# Patient Record
Sex: Female | Born: 1945 | Race: White | Hispanic: No | Marital: Married | Smoking: Former smoker
Health system: Southern US, Community
[De-identification: ages and names within clinical notes are randomized; demographics above are authoritative.]

## PROBLEM LIST (undated history)

## (undated) ENCOUNTER — Ambulatory Visit

## (undated) DIAGNOSIS — E7800 Pure hypercholesterolemia, unspecified: Secondary | ICD-10-CM

## (undated) DIAGNOSIS — I700 Atherosclerosis of aorta: Secondary | ICD-10-CM

## (undated) DIAGNOSIS — M25559 Pain in unspecified hip: Secondary | ICD-10-CM

## (undated) DIAGNOSIS — I499 Cardiac arrhythmia, unspecified: Secondary | ICD-10-CM

## (undated) DIAGNOSIS — M1990 Unspecified osteoarthritis, unspecified site: Secondary | ICD-10-CM

## (undated) HISTORY — DX: Paroxysmal atrial fibrillation: I48.0

## (undated) HISTORY — DX: Unspecified osteoarthritis, unspecified site: M19.90

## (undated) HISTORY — DX: Unspecified atrial fibrillation: I48.91

## (undated) HISTORY — DX: Essential (primary) hypertension: I10

## (undated) HISTORY — DX: Pain in unspecified hip: M25.559

## (undated) HISTORY — DX: Atherosclerosis of aorta: I70.0

## (undated) HISTORY — DX: Pure hypercholesterolemia, unspecified: E78.00

---

## 1980-04-26 HISTORY — PX: TUBAL LIGATION: SHX77

## 1997-09-27 ENCOUNTER — Ambulatory Visit (HOSPITAL_COMMUNITY): Admission: RE | Admit: 1997-09-27 | Discharge: 1997-09-27 | Payer: Self-pay | Admitting: Gastroenterology

## 2000-06-16 ENCOUNTER — Other Ambulatory Visit: Admission: RE | Admit: 2000-06-16 | Discharge: 2000-06-16 | Payer: Self-pay | Admitting: Gynecology

## 2001-07-31 ENCOUNTER — Other Ambulatory Visit: Admission: RE | Admit: 2001-07-31 | Discharge: 2001-07-31 | Payer: Self-pay | Admitting: Gynecology

## 2002-12-10 ENCOUNTER — Other Ambulatory Visit: Admission: RE | Admit: 2002-12-10 | Discharge: 2002-12-10 | Payer: Self-pay | Admitting: Gynecology

## 2003-12-12 ENCOUNTER — Other Ambulatory Visit: Admission: RE | Admit: 2003-12-12 | Discharge: 2003-12-12 | Payer: Self-pay | Admitting: Gynecology

## 2004-04-26 HISTORY — PX: RIGHT OOPHORECTOMY: SHX2359

## 2005-01-21 ENCOUNTER — Other Ambulatory Visit: Admission: RE | Admit: 2005-01-21 | Discharge: 2005-01-21 | Payer: Self-pay | Admitting: Gynecology

## 2005-02-09 ENCOUNTER — Encounter (INDEPENDENT_AMBULATORY_CARE_PROVIDER_SITE_OTHER): Payer: Self-pay | Admitting: Specialist

## 2005-02-09 ENCOUNTER — Encounter (INDEPENDENT_AMBULATORY_CARE_PROVIDER_SITE_OTHER): Payer: Self-pay | Admitting: *Deleted

## 2005-02-09 ENCOUNTER — Ambulatory Visit: Admission: RE | Admit: 2005-02-09 | Discharge: 2005-02-09 | Payer: Self-pay | Admitting: Obstetrics and Gynecology

## 2008-04-11 ENCOUNTER — Other Ambulatory Visit: Admission: RE | Admit: 2008-04-11 | Discharge: 2008-04-11 | Payer: Self-pay | Admitting: Family Medicine

## 2010-06-24 ENCOUNTER — Other Ambulatory Visit: Payer: Self-pay | Admitting: Family Medicine

## 2010-06-24 ENCOUNTER — Other Ambulatory Visit (HOSPITAL_COMMUNITY)
Admission: RE | Admit: 2010-06-24 | Discharge: 2010-06-24 | Disposition: A | Discharge status: Home / Self Care | Payer: Federal, State, Local not specified - PPO | Source: Ambulatory Visit | Attending: Family Medicine | Admitting: Family Medicine

## 2010-06-24 DIAGNOSIS — Z124 Encounter for screening for malignant neoplasm of cervix: Secondary | ICD-10-CM | POA: Insufficient documentation

## 2010-09-10 ENCOUNTER — Ambulatory Visit
Admission: RE | Admit: 2010-09-10 | Discharge: 2010-09-10 | Disposition: A | Discharge status: Home / Self Care | Payer: Federal, State, Local not specified - PPO | Source: Ambulatory Visit | Attending: Family Medicine | Admitting: Family Medicine

## 2010-09-10 ENCOUNTER — Other Ambulatory Visit: Payer: Self-pay | Admitting: Family Medicine

## 2010-09-10 DIAGNOSIS — M25551 Pain in right hip: Secondary | ICD-10-CM

## 2010-09-11 NOTE — Op Note (Signed)
NAMEPETRICE, Katrina Oliver NO.:  [id number]   MEDICAL RECORD NO.:  [id number]          PATIENT TYPE:  INP   LOCATION:  9399                          FACILITY:  [facility]   PHYSICIAN:  Malva Limes, M.D.    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   POSTOPERATIVE DIAGNOSIS:  Left solid ovarian tumor, probable fibroadenoma.   PROCEDURE:  1.  Diagnostic laparoscopy with left salpingo-oophorectomy.  2.  Peritoneal washings.   SURGEON:  Malva Limes, M.D.   ASSISTANT:  Luvenia Redden, M.D.   ANESTHESIA:  General endotracheal.   ANTIBIOTICS:  Ancef 1 gram.   DRAINS:  Red rubber catheter to bladder.   ESTIMATED BLOOD LOSS:  Minimal.   COMPLICATIONS:  None.   SPECIMENS:  Left ovary and fallopian tube sent to pathology along with  peritoneal washings.   DESCRIPTION OF PROCEDURE:  The patient was taken to the operating room where  she was placed in the dorsal supine position.  General anesthesia was  administered without complications.  She was then placed in the dorsal  lithotomy position.  She was prepped with Betadine and her bladder drained  with a red rubber catheter.  The patient then had a Hulka tenaculum applied  to the anterior cervical lip.  Her umbilicus was injected with 0.25%  Marcaine and a vertical skin was made.  The fascia was grasped with Kochers  and opened with Mayo scissors.  The parietoperitoneum was entered bluntly.  Sutures were placed laterally.  The Hasson cannula was placed into the  abdominal cavity and 3 liters of carbon dioxide insufflated.  The patient  was placed in Trendelenburg.  A 5 mm port was placed in the suprapubic and  left lower quadrant under direct visualization.  Once this was accomplished,  the abdominal and pelvic areas were inspected.  There was no  evidence of any  satellite tumors anywhere.  There were no growths on the outside surface of  the left ovary.  There were no pelvic adhesions.  At this point  approximately 30 mL of peritoneal fluid was removed from the cul-de-sac and  sent for evaluation.  Next, the left ovary was grasped and the  infundibulopelvic ligament cauterized and transected with the tripolar  instrument.  The ovarian ligament was then cauterized, transected with the  tripolar followed by the fallopian tube.  Once the left ovary and fallopian  tube were freed, they were placed in the peritoneal cavity.  The 5 mm port  in the suprapubic region was exchanged for a 10 mm port.  The Endobag was  placed into the peritoneal cavity and the ovary and fallopian tube placed  into the bag.  Following this, the bag was lifted up to the anterior  abdominal wall where the incision was extended and the ovary and bag removed  intact.  At this point the parietoperitoneum was closed using 2-0 Vicryl  suture in a running fashion and the fascia was  closed using 0 Vicryl suture  in a running fashion.  Subcuticular tissue was closed using interrupted 2-0  Vicryl suture.  The skin was closed using 4-0 Rapide in a subcuticular  fashion.  The 5 mm port in the left lower quadrant was closed using  Dermabond.  The pelvis was then checked and no evidence of bleeding was  found from any of the port sites or the operative sites.  The  pneumoperitoneum was released.  Instruments all removed and the fascia  closed with interrupted 0 Vicryl suture in the umbilicus and the skin with 4-  0 Vicryl suture in an interrupted fashion.  The patient tolerated the  procedure well.  She was taken to the recovery room stable.  Needle, sponge,  and instrument counts correct x1.  The patient will be discharged to home.  She will be sent home with Percocet to take p.r.n.  She will follow up in  the office in 2 weeks.            ______________________________  Malva Limes, M.D.     MA/MEDQ  D:  02/09/2005  T:  02/09/2005  Job:  [id number]

## 2011-11-30 ENCOUNTER — Other Ambulatory Visit: Payer: Self-pay | Admitting: Gastroenterology

## 2015-09-04 ENCOUNTER — Encounter (HOSPITAL_COMMUNITY): Payer: Self-pay | Admitting: Nurse Practitioner

## 2015-09-04 ENCOUNTER — Ambulatory Visit (HOSPITAL_COMMUNITY)
Admission: RE | Admit: 2015-09-04 | Discharge: 2015-09-04 | Disposition: A | Discharge status: Home / Self Care | Payer: Federal, State, Local not specified - PPO | Source: Ambulatory Visit | Attending: Nurse Practitioner | Admitting: Nurse Practitioner

## 2015-09-04 VITALS — BP 120/78 | HR 79 | Ht 62.75 in | Wt 131.2 lb

## 2015-09-04 DIAGNOSIS — Z79899 Other long term (current) drug therapy: Secondary | ICD-10-CM | POA: Insufficient documentation

## 2015-09-04 DIAGNOSIS — Z87891 Personal history of nicotine dependence: Secondary | ICD-10-CM | POA: Diagnosis not present

## 2015-09-04 DIAGNOSIS — I480 Paroxysmal atrial fibrillation: Secondary | ICD-10-CM

## 2015-09-04 MED ORDER — APIXABAN 5 MG PO TABS: 5.0000 mg | ORAL_TABLET | Freq: Two times a day (BID) | ORAL | Status: DC

## 2015-09-04 NOTE — Patient Instructions (Signed)
Your physician has recommended you make the following change in your medication:  1)Stop Aspirin 2)Start Eliquis 5mg twice a day  

## 2015-09-04 NOTE — Progress Notes (Signed)
Patient ID: Katrina Oliver Loh, female   DOB: [birth year], 70 y.o.   MRN: [id number]     Primary Care Physician: Dr. Shaune Pollackonna Gates Referring Physician: Same    Katrina Oliver Netter is a 70 y.o. female with a h/o PAF that she first noticed very brief episodes that lasted 1-2 hours. She presented to the PCP office in March with an epiosode of irregular heart beat but by the time she got there, EKG showed SR. She presented to her PCP office yesterday and was found to have afib with rvr at 138 bpm. She was started on metoprolol and asked to be seen here today.  Now in SR.She does have a CHA2DS2VASc of 2  and would by guidelines be a candidate for anticoagulant. No bleeding issues.  Will stop asa. No previous echo.  Lifestyle issues reviewed and pt  states she is active at the gym, normal weight, no snoring history, no tobacco, does drink two alcoholic drinks a night.  Today, she denies symptoms of palpitations, chest pain, shortness of breath, orthopnea, PND, lower extremity edema, dizziness, presyncope, syncope, or neurologic sequela. The patient is tolerating medications without difficulties and is otherwise without complaint today.   No past medical history on file. Past Surgical History  Procedure Laterality Date  . Right oophorectomy    . Tubal ligation      Current Outpatient Prescriptions  Medication Sig Dispense Refill  . calcium carbonate (OSCAL) 1500 (600 Ca) MG TABS tablet Take 600 mg of elemental calcium by mouth daily with breakfast.    . cetirizine (ZYRTEC) 10 MG tablet Take 10 mg by mouth daily.    . cholecalciferol (VITAMIN D) 1000 units tablet Take 1,000 Units by mouth daily.    . Estradiol (VAGIFEM) 10 MCG TABS vaginal tablet Place 1 tablet vaginally 2 (two) times a week.    . fluticasone (FLONASE) 50 MCG/ACT nasal spray Place 1 spray into both nostrils as needed for allergies or rhinitis.    . metoprolol tartrate (LOPRESSOR) 25 MG tablet Take 25 mg by mouth 2 (two) times  daily.    . Multiple Vitamin (MULTIVITAMIN) tablet Take 1 tablet by mouth daily.    Marland Kitchen. olopatadine (PATANOL) 0.1 % ophthalmic solution Place 1 drop into both eyes as needed for allergies.    Marland Kitchen. omega-3 acid ethyl esters (LOVAZA) 1 g capsule Take 1 g by mouth 2 (two) times daily.    . ranitidine (ZANTAC) 75 MG tablet Take 75 mg by mouth as needed for heartburn.    Marland Kitchen. apixaban (ELIQUIS) 5 MG TABS tablet Take 1 tablet (5 mg total) by mouth 2 (two) times daily. 60 tablet 1   No current facility-administered medications for this encounter.    Allergies  Allergen Reactions  . Other     alloway eye drops    Social History   Social History  . Marital Status: Single    Spouse Name: N/A  . Number of Children: N/A  . Years of Education: N/A   Occupational History  . Not on file.   Social History Main Topics  . Smoking status: Former Games developermoker  . Smokeless tobacco: Not on file  . Alcohol Use: Not on file  . Drug Use: Not on file  . Sexual Activity: Not on file   Other Topics Concern  . Not on file   Social History Narrative  . No narrative on file    No family history on file.  ROS- All systems are reviewed and negative except  as per the HPI above  Physical Exam: Filed Vitals:   09/04/15 1340  BP: 120/78  Pulse: 79  Height: 5' 2.75" (1.594 m)  Weight: 131 lb 3.2 oz (59.512 kg)    GEN- The patient is well appearing, alert and oriented x 3 today.   Head- normocephalic, atraumatic Eyes-  Sclera clear, conjunctiva pink Ears- hearing intact Oropharynx- clear Neck- supple, no JVP Lymph- no cervical lymphadenopathy Lungs- Clear to ausculation bilaterally, normal work of breathing Heart- Regular rate and rhythm, no murmurs, rubs or gallops, PMI not laterally displaced GI- soft, NT, ND, + BS Extremities- no clubbing, cyanosis, or edema MS- no significant deformity or atrophy Skin- no rash or lesion Psych- euthymic mood, full affect Neuro- strength and sensation are  intact  EKG- NSR at 79 bpm, pr int 128 ms, qrs int 64 ms, qtc 410 ms EKG from PCP's office reviewed that showed afib/flutter at 138 bpm Labs-5/10 PCP labs creatinine 0.77, bun 16, K+ 4.7  Assessment and Plan: 1. PAF Continue metoprolol 25 mg bid Can take an extra 1/2 tab of metoprolol for breakthrough episodes Echo    2. Chadsvasc score of 2  No bleeding history Start eliquis 5 mg bid, 30 day card given, watch for signs of bleeding Stop asa Try to avoid NSAIDS on blood thinner   3. Lifestyle factors that trigger afib Decrease alcohol to no more than 2 drinks a week Continue exercise routine, normal weight Denies snoring history    F/u two weeks in afib clinic  Alycen Mack C. Matthew Folks [facility] [address] [place], [place] [address] [phone]

## 2015-09-05 ENCOUNTER — Ambulatory Visit (HOSPITAL_COMMUNITY)
Admission: RE | Admit: 2015-09-05 | Discharge: 2015-09-05 | Disposition: A | Discharge status: Home / Self Care | Payer: Federal, State, Local not specified - PPO | Source: Ambulatory Visit | Attending: Nurse Practitioner | Admitting: Nurse Practitioner

## 2015-09-05 DIAGNOSIS — I480 Paroxysmal atrial fibrillation: Secondary | ICD-10-CM | POA: Insufficient documentation

## 2015-09-05 DIAGNOSIS — I4891 Unspecified atrial fibrillation: Secondary | ICD-10-CM | POA: Diagnosis present

## 2015-09-05 DIAGNOSIS — I340 Nonrheumatic mitral (valve) insufficiency: Secondary | ICD-10-CM | POA: Insufficient documentation

## 2015-09-05 NOTE — Progress Notes (Signed)
Echocardiogram 2D Echocardiogram has been performed.  Katrina Oliver, Katrina Oliver M 09/05/2015, 12:01 PM

## 2015-09-07 ENCOUNTER — Encounter (HOSPITAL_COMMUNITY): Payer: Self-pay | Admitting: Nurse Practitioner

## 2015-09-17 ENCOUNTER — Encounter (HOSPITAL_COMMUNITY): Payer: Self-pay | Admitting: Nurse Practitioner

## 2015-09-17 ENCOUNTER — Ambulatory Visit (HOSPITAL_COMMUNITY)
Admission: RE | Admit: 2015-09-17 | Discharge: 2015-09-17 | Disposition: A | Discharge status: Home / Self Care | Payer: Federal, State, Local not specified - PPO | Source: Ambulatory Visit | Attending: Nurse Practitioner | Admitting: Nurse Practitioner

## 2015-09-17 VITALS — BP 128/72 | HR 66 | Ht 62.75 in | Wt 131.0 lb

## 2015-09-17 DIAGNOSIS — Z87891 Personal history of nicotine dependence: Secondary | ICD-10-CM | POA: Insufficient documentation

## 2015-09-17 DIAGNOSIS — Z79899 Other long term (current) drug therapy: Secondary | ICD-10-CM | POA: Diagnosis not present

## 2015-09-17 DIAGNOSIS — I480 Paroxysmal atrial fibrillation: Secondary | ICD-10-CM | POA: Insufficient documentation

## 2015-09-17 DIAGNOSIS — Z7901 Long term (current) use of anticoagulants: Secondary | ICD-10-CM | POA: Diagnosis not present

## 2015-09-17 DIAGNOSIS — I4891 Unspecified atrial fibrillation: Secondary | ICD-10-CM | POA: Diagnosis present

## 2015-09-17 DIAGNOSIS — I4892 Unspecified atrial flutter: Secondary | ICD-10-CM | POA: Insufficient documentation

## 2015-09-17 MED ORDER — APIXABAN 5 MG PO TABS: 5.0000 mg | ORAL_TABLET | Freq: Two times a day (BID) | ORAL | Status: DC

## 2015-09-17 NOTE — Patient Instructions (Signed)
Your physician has recommended you make the following change in your medication:  1)Decrease metoprolol to 12.5mg twice a day    

## 2015-09-17 NOTE — Progress Notes (Signed)
Patient ID: Mart PiggsJacqueline T Eynon, female   DOB: [birth year], 70 y.o.   MRN: [id number]        Primary Care Physician: Dr. Shaune Pollackonna Gates Referring Physician: Same    Mart PiggsJacqueline T Mceuen is a 70 y.o. female with a h/o PAF that she first noticed very brief episodes that lasted 1-2 hours. She presented to the PCP office in March with an epiosode of irregular heart beat but by the time she got there, EKG showed SR. She presented to her PCP office yesterday and was found to have afib with rvr at 138 bpm. She was started on metoprolol and asked to be seen here today.  Now in SR.She does have a CHA2DS2VASc of 2  and would by guidelines be a candidate for anticoagulant. No bleeding issues.  Will stop asa. No previous echo.  Lifestyle issues reviewed and pt  states she is active at the gym, normal weight, no snoring history, no tobacco, does drink two alcoholic drinks a night.She was started on eliquis and echo was ordered.  She returns 5/24, no further afib, but is c/o fatigue probably from metoprolol. Echo showed normal structure heart. No bleeding issues with eliquis.  Today, she denies symptoms of palpitations, chest pain, shortness of breath, orthopnea, PND, lower extremity edema, dizziness, presyncope, syncope, or neurologic sequela. The patient is tolerating medications without difficulties and is otherwise without complaint today.   No past medical history on file. Past Surgical History  Procedure Laterality Date  . Right oophorectomy    . Tubal ligation      Current Outpatient Prescriptions  Medication Sig Dispense Refill  . apixaban (ELIQUIS) 5 MG TABS tablet Take 1 tablet (5 mg total) by mouth 2 (two) times daily. 180 tablet 3  . calcium carbonate (OSCAL) 1500 (600 Ca) MG TABS tablet Take 600 mg of elemental calcium by mouth daily with breakfast.    . cetirizine (ZYRTEC) 10 MG tablet Take 10 mg by mouth daily.    . cholecalciferol (VITAMIN D) 1000 units tablet Take 1,000 Units by mouth  daily.    . Estradiol (VAGIFEM) 10 MCG TABS vaginal tablet Place 1 tablet vaginally 2 (two) times a week.    . fluticasone (FLONASE) 50 MCG/ACT nasal spray Place 1 spray into both nostrils as needed for allergies or rhinitis.    . metoprolol tartrate (LOPRESSOR) 25 MG tablet Take 12.5 mg by mouth 2 (two) times daily.    . Multiple Vitamin (MULTIVITAMIN) tablet Take 1 tablet by mouth daily.    Marland Kitchen. olopatadine (PATANOL) 0.1 % ophthalmic solution Place 1 drop into both eyes as needed for allergies.    Marland Kitchen. omega-3 acid ethyl esters (LOVAZA) 1 g capsule Take 1 g by mouth 2 (two) times daily.    . ranitidine (ZANTAC) 75 MG tablet Take 75 mg by mouth as needed for heartburn.     No current facility-administered medications for this encounter.    Allergies  Allergen Reactions  . Other     alloway eye drops    Social History   Social History  . Marital Status: Single    Spouse Name: N/A  . Number of Children: N/A  . Years of Education: N/A   Occupational History  . Not on file.   Social History Main Topics  . Smoking status: Former Games developermoker  . Smokeless tobacco: Not on file  . Alcohol Use: Not on file  . Drug Use: Not on file  . Sexual Activity: Not on file   Other  Topics Concern  . Not on file   Social History Narrative    No family history on file.  ROS- All systems are reviewed and negative except as per the HPI above  Physical Exam: Filed Vitals:   09/17/15 1100  BP: 128/72  Pulse: 66  Height: 5' 2.75" (1.594 m)  Weight: 131 lb (59.421 kg)    GEN- The patient is well appearing, alert and oriented x 3 today.   Head- normocephalic, atraumatic Eyes-  Sclera clear, conjunctiva pink Ears- hearing intact Oropharynx- clear Neck- supple, no JVP Lymph- no cervical lymphadenopathy Lungs- Clear to ausculation bilaterally, normal work of breathing Heart- Regular rate and rhythm, no murmurs, rubs or gallops, PMI not laterally displaced GI- soft, NT, ND, + BS Extremities- no  clubbing, cyanosis, or edema MS- no significant deformity or atrophy Skin- no rash or lesion Psych- euthymic mood, full affect Neuro- strength and sensation are intact  EKG- NSR at 66 bpm, pr int 124 ms, qrs int 74 ms, qtc 419 ms EKG from PCP's office reviewed that showed afib/flutter at 138 bpm Labs-5/10 PCP labs creatinine 0.77, bun 16, K+ 4.7  Assessment and Plan: 1. PAF Decrease metoprolol 25 mg 1/2 bid for c/o fatigue Can take an extra 1/2 tab of metoprolol for breakthrough episodes If that does not resolve fatigue , can stop metoprolol and try cardizem cd 120 mg a day Pt will let office know of results decreasing BB   2. Chadsvasc score of 2  No bleeding history Start eliquis 5 mg bid, 30 day card given, watch for signs of bleeding Stop asa Try to avoid NSAIDS on blood thinner   3. Lifestyle factors that trigger afib Decrease alcohol to no more than 2 drinks a week Continue exercise routine, normal weight Denies snoring history    F/u 3 months in afib clinic  Keinan Brouillet C. Matthew Folks [facility] [address] [place], [place] [address] [phone]

## 2015-10-06 ENCOUNTER — Encounter (HOSPITAL_COMMUNITY): Payer: Self-pay | Admitting: Nurse Practitioner

## 2015-10-06 ENCOUNTER — Other Ambulatory Visit (HOSPITAL_COMMUNITY): Payer: Self-pay | Admitting: *Deleted

## 2015-10-06 MED ORDER — METOPROLOL TARTRATE 25 MG PO TABS: 12.5000 mg | ORAL_TABLET | Freq: Two times a day (BID) | ORAL | Status: DC

## 2015-12-24 ENCOUNTER — Ambulatory Visit (HOSPITAL_COMMUNITY)
Admission: RE | Admit: 2015-12-24 | Discharge: 2015-12-24 | Disposition: A | Discharge status: Home / Self Care | Payer: Federal, State, Local not specified - PPO | Source: Ambulatory Visit | Attending: Nurse Practitioner | Admitting: Nurse Practitioner

## 2015-12-24 ENCOUNTER — Encounter (HOSPITAL_COMMUNITY): Payer: Self-pay | Admitting: Nurse Practitioner

## 2015-12-24 VITALS — BP 140/84 | HR 74 | Ht 62.75 in | Wt 131.6 lb

## 2015-12-24 DIAGNOSIS — I480 Paroxysmal atrial fibrillation: Secondary | ICD-10-CM | POA: Diagnosis present

## 2015-12-24 DIAGNOSIS — Z79899 Other long term (current) drug therapy: Secondary | ICD-10-CM | POA: Diagnosis not present

## 2015-12-24 DIAGNOSIS — Z87891 Personal history of nicotine dependence: Secondary | ICD-10-CM | POA: Diagnosis not present

## 2015-12-24 NOTE — Progress Notes (Signed)
Patient ID: Katrina Oliver, female   DOB: 1946/02/22, 70 y.o.   MRN: [id number]        Primary Care Physician: Dr. Shaune Pollack Referring Physician: Same    Katrina Oliver is a 70 y.o. female with a h/o PAF that she first noticed very brief episodes that lasted 1-2 hours. She presented to the PCP office in March with an epiosode of irregular heart beat but by the time she got there, EKG showed SR. She presented to her PCP office yesterday and was found to have afib with rvr at 138 bpm. She was started on metoprolol and asked to be seen here today.  Now in SR.She does have a CHA2DS2VASc of 2  and would by guidelines be a candidate for anticoagulant. No bleeding issues.  Will stop asa. No previous echo.  Lifestyle issues reviewed and pt  states she is active at the gym, normal weight, no snoring history, no tobacco, does drink two alcoholic drinks a night.She was started on eliquis and echo was ordered.  She returns 5/24, no further afib, but is c/o fatigue probably from metoprolol. Echo showed normal structure heart. No bleeding issues with eliquis.  F/u 8/30- She reports that she is doing well. Only had one episode of irregular heart beat that resolved within one hour with an extra metoprolol in the last three months. Cutting dose of metoprolol helped with the feeling of fatigue. No issues with  DOAC.   Today, she denies symptoms of palpitations, chest pain, shortness of breath, orthopnea, PND, lower extremity edema, dizziness, presyncope, syncope, or neurologic sequela. The patient is tolerating medications without difficulties and is otherwise without complaint today.   No past medical history on file. Past Surgical History:  Procedure Laterality Date  . RIGHT OOPHORECTOMY    . TUBAL LIGATION      Current Outpatient Prescriptions  Medication Sig Dispense Refill  . apixaban (ELIQUIS) 5 MG TABS tablet Take 1 tablet (5 mg total) by mouth 2 (two) times daily. 180 tablet 3  .  calcium carbonate (OSCAL) 1500 (600 Ca) MG TABS tablet Take 600 mg of elemental calcium by mouth daily with breakfast.    . cetirizine (ZYRTEC) 10 MG tablet Take 10 mg by mouth daily.    . cholecalciferol (VITAMIN D) 1000 units tablet Take 1,000 Units by mouth daily.    . Estradiol (VAGIFEM) 10 MCG TABS vaginal tablet Place 1 tablet vaginally 2 (two) times a week.    . fluticasone (FLONASE) 50 MCG/ACT nasal spray Place 1 spray into both nostrils as needed for allergies or rhinitis.    . metoprolol tartrate (LOPRESSOR) 25 MG tablet Take 0.5 tablets (12.5 mg total) by mouth 2 (two) times daily. 90 tablet 3  . Multiple Vitamin (MULTIVITAMIN) tablet Take 1 tablet by mouth daily.    Marland Kitchen olopatadine (PATANOL) 0.1 % ophthalmic solution Place 1 drop into both eyes as needed for allergies.    Marland Kitchen omega-3 acid ethyl esters (LOVAZA) 1 g capsule Take 1 g by mouth daily.     . ranitidine (ZANTAC) 75 MG tablet Take 75 mg by mouth as needed for heartburn.     No current facility-administered medications for this encounter.     Allergies  Allergen Reactions  . Other     alloway eye drops    Social History   Social History  . Marital status: Single    Spouse name: N/A  . Number of children: N/A  . Years of education: N/A  Occupational History  . Not on file.   Social History Main Topics  . Smoking status: Former Games developermoker  . Smokeless tobacco: Not on file  . Alcohol use Not on file  . Drug use: Unknown  . Sexual activity: Not on file   Other Topics Concern  . Not on file   Social History Narrative  . No narrative on file    No family history on file.  ROS- All systems are reviewed and negative except as per the HPI above  Physical Exam: Vitals:   12/24/15 1055  BP: 140/84  Pulse: 74  Weight: 131 lb 9.6 oz (59.7 kg)  Height: 5' 2.75" (1.594 m)    GEN- The patient is well appearing, alert and oriented x 3 today.   Head- normocephalic, atraumatic Eyes-  Sclera clear, conjunctiva  pink Ears- hearing intact Oropharynx- clear Neck- supple, no JVP Lymph- no cervical lymphadenopathy Lungs- Clear to ausculation bilaterally, normal work of breathing Heart- Regular rate and rhythm, no murmurs, rubs or gallops, PMI not laterally displaced GI- soft, NT, ND, + BS Extremities- no clubbing, cyanosis, or edema MS- no significant deformity or atrophy Skin- no rash or lesion Psych- euthymic mood, full affect Neuro- strength and sensation are intact  EKG- NSR at 77 bpm, with pr int 126 ms, qrs int 74 ms, qtc 435 ms Labs-5/10 PCP labs creatinine 0.77, bun 16, K+ 4.7 Echo-Study Conclusions  - Left ventricle: The cavity size was normal. Systolic function was   normal. The estimated ejection fraction was in the range of 55%   to 60%. Wall motion was normal; there were no regional wall   motion abnormalities. Left ventricular diastolic function   parameters were normal. - Mitral valve: There was mild regurgitation.  Assessment and Plan: 1. PAF with low af burden Continue metoprolol 25 mg 1/2 bid  Can take an extra 1/2 tab of metoprolol for breakthrough episodes   2. Chadsvasc score of 2  No bleeding history Start eliquis 5 mg bid, watch for signs of bleeding Off asa Try to avoid NSAIDS on blood thinner   3. Lifestyle factors that trigger afib Decrease alcohol to no more than 2 drinks a week Continue exercise routine, normal weight Denies snoring history    F/u 6 months in afib clinic  Chee Dimon C. Matthew Folksarroll, ANP-C [facility] [address] [place], [place] [address] [phone]

## 2016-01-06 ENCOUNTER — Encounter (HOSPITAL_COMMUNITY): Payer: Self-pay | Admitting: Nurse Practitioner

## 2016-06-30 ENCOUNTER — Ambulatory Visit (HOSPITAL_COMMUNITY)
Admission: RE | Admit: 2016-06-30 | Discharge: 2016-06-30 | Disposition: A | Discharge status: Home / Self Care | Payer: Federal, State, Local not specified - PPO | Source: Ambulatory Visit | Attending: Nurse Practitioner | Admitting: Nurse Practitioner

## 2016-06-30 ENCOUNTER — Encounter (HOSPITAL_COMMUNITY): Payer: Self-pay | Admitting: Nurse Practitioner

## 2016-06-30 VITALS — BP 134/78 | HR 76 | Ht 62.75 in | Wt 130.0 lb

## 2016-06-30 DIAGNOSIS — Z888 Allergy status to other drugs, medicaments and biological substances status: Secondary | ICD-10-CM | POA: Diagnosis not present

## 2016-06-30 DIAGNOSIS — Z87891 Personal history of nicotine dependence: Secondary | ICD-10-CM | POA: Insufficient documentation

## 2016-06-30 DIAGNOSIS — I480 Paroxysmal atrial fibrillation: Secondary | ICD-10-CM | POA: Diagnosis not present

## 2016-06-30 DIAGNOSIS — Z90721 Acquired absence of ovaries, unilateral: Secondary | ICD-10-CM | POA: Diagnosis not present

## 2016-06-30 DIAGNOSIS — Z9851 Tubal ligation status: Secondary | ICD-10-CM | POA: Insufficient documentation

## 2016-06-30 DIAGNOSIS — Z7902 Long term (current) use of antithrombotics/antiplatelets: Secondary | ICD-10-CM | POA: Diagnosis not present

## 2016-06-30 NOTE — Progress Notes (Addendum)
Patient ID: ADELL KOVAL, female   DOB: Aug 24, 1945, 71 y.o.   MRN: [id number]        Primary Care Physician: Dr. Shaune Pollack Referring Physician: Same    Katrina Oliver is a 71 y.o. female with a h/o PAF that she first noticed very brief episodes that lasted 1-2 hours. She presented to the PCP office in March with an epiosode of irregular heart beat but by the time she got there, EKG showed SR. She presented to her PCP office yesterday and was found to have afib with rvr at 138 bpm. She was started on metoprolol and asked to be seen here today.  Now in SR.She does have a CHA2DS2VASc of 2  and would by guidelines be a candidate for anticoagulant. No bleeding issues.  Will stop asa. No previous echo.  Lifestyle issues reviewed and pt  states she is active at the gym, normal weight, no snoring history, no tobacco, does drink two alcoholic drinks a night.She was started on eliquis and echo was ordered.  She returns 5/24, no further afib, but is c/o fatigue probably from metoprolol. Echo showed normal structure heart. No bleeding issues with eliquis.  F/u 8/30- She reports that she is doing well. Only had one episode of irregular heart beat that resolved within one hour with an extra metoprolol in the last three months. Cutting dose of metoprolol helped with the feeling of fatigue. No issues with  DOAC.   F/u 06/30/16. She reports that she has done well with 2 breakthrough afib episodes in 6 months. One lasted one hour, second episode lasted 2 hours. She takes an extra metoprolol with afib. No issues with blood thinner. Has physical in May with labs.  Today, she denies symptoms of palpitations, chest pain, shortness of breath, orthopnea, PND, lower extremity edema, dizziness, presyncope, syncope, or neurologic sequela. The patient is tolerating medications without difficulties and is otherwise without complaint today.   No past medical history on file. Past Surgical History:  Procedure  Laterality Date  . RIGHT OOPHORECTOMY    . TUBAL LIGATION      Current Outpatient Prescriptions  Medication Sig Dispense Refill  . apixaban (ELIQUIS) 5 MG TABS tablet Take 1 tablet (5 mg total) by mouth 2 (two) times daily. 180 tablet 3  . calcium carbonate (OSCAL) 1500 (600 Ca) MG TABS tablet Take 600 mg of elemental calcium by mouth daily with breakfast.    . cetirizine (ZYRTEC) 10 MG tablet Take 10 mg by mouth daily.    . cholecalciferol (VITAMIN D) 1000 units tablet Take 1,000 Units by mouth daily.    . Estradiol (VAGIFEM) 10 MCG TABS vaginal tablet Place 1 tablet vaginally 2 (two) times a week.    . fluticasone (FLONASE) 50 MCG/ACT nasal spray Place 1 spray into both nostrils as needed for allergies or rhinitis.    . metoprolol tartrate (LOPRESSOR) 25 MG tablet Take 0.5 tablets (12.5 mg total) by mouth 2 (two) times daily. 90 tablet 3  . Multiple Vitamin (MULTIVITAMIN) tablet Take 1 tablet by mouth daily.    Marland Kitchen olopatadine (PATANOL) 0.1 % ophthalmic solution Place 1 drop into both eyes as needed for allergies.    Marland Kitchen omega-3 acid ethyl esters (LOVAZA) 1 g capsule Take 1 g by mouth daily.     . ranitidine (ZANTAC) 75 MG tablet Take 75 mg by mouth as needed for heartburn.     No current facility-administered medications for this encounter.     Allergies  Allergen Reactions  . Other     alloway eye drops    Social History   Social History  . Marital status: Single    Spouse name: N/A  . Number of children: N/A  . Years of education: N/A   Occupational History  . Not on file.   Social History Main Topics  . Smoking status: Former Games developermoker  . Smokeless tobacco: Never Used  . Alcohol use Not on file  . Drug use: Unknown  . Sexual activity: Not on file   Other Topics Concern  . Not on file   Social History Narrative  . No narrative on file    No family history on file.  ROS- All systems are reviewed and negative except as per the HPI above  Physical Exam: Vitals:    06/30/16 1059  BP: 134/78  Pulse: 76  Weight: 130 lb (59 kg)  Height: 5' 2.75" (1.594 m)    GEN- The patient is well appearing, alert and oriented x 3 today.   Head- normocephalic, atraumatic Eyes-  Sclera clear, conjunctiva pink Ears- hearing intact Oropharynx- clear Neck- supple, no JVP Lymph- no cervical lymphadenopathy Lungs- Clear to ausculation bilaterally, normal work of breathing Heart- Regular rate and rhythm, no murmurs, rubs or gallops, PMI not laterally displaced GI- soft, NT, ND, + BS Extremities- no clubbing, cyanosis, or edema MS- no significant deformity or atrophy Skin- no rash or lesion Psych- euthymic mood, full affect Neuro- strength and sensation are intact  EKG- NSR at 76 bpm, with pr int 124 ms, qrs int 64 ms, qtc 450 ms Labs-5/10 PCP labs creatinine 0.77, bun 16, K+ 4.7 Echo-Study Conclusions  - Left ventricle: The cavity size was normal. Systolic function was   normal. The estimated ejection fraction was in the range of 55%   to 60%. Wall motion was normal; there were no regional wall   motion abnormalities. Left ventricular diastolic function   parameters were normal. - Mitral valve: There was mild regurgitation.  Assessment and Plan: 1. PAF with low af burden Continue metoprolol 25 mg 1/2 bid  Can take an extra 1/2 tab of metoprolol for breakthrough episodes  2. Chadsvasc score of 2  No bleeding history Start eliquis 5 mg bid, watch for signs of bleeding Is having labs drawn in May for physical and will have labs faxed here for review Try to avoid NSAIDS on blood thinner  3. Lifestyle factors that trigger afib Alcohol to no more than 2 drinks a week Continue exercise routine, normal weight Denies snoring history   F/u 6 months in afib clinic  Katrina Oliver, ANP-C Afib Clinic [facility] [address] [place], [place] [address] [phone]

## 2016-06-30 NOTE — Addendum Note (Signed)
Encounter addended by: Newman Niponna C Whitnie Deleon, NP on: 06/30/2016  1:54 PM<BR>    Actions taken: Sign clinical note

## 2016-09-05 ENCOUNTER — Other Ambulatory Visit (HOSPITAL_COMMUNITY): Payer: Self-pay | Admitting: Nurse Practitioner

## 2016-12-06 ENCOUNTER — Telehealth (HOSPITAL_COMMUNITY): Payer: Self-pay | Admitting: *Deleted

## 2016-12-06 NOTE — Telephone Encounter (Signed)
Pt cld to check with Lupita LeashDonna regarding starting Fosamax due to the package insert stating an increased risk of a-fib.  Per Rudi Cocoonna Carroll, NP who consulted with pharmacy there has been no noticed correlation between fosamax and increased afib. Pt should still check with dispensing pharmacist as well.  Pt was advised and understood.

## 2017-04-06 ENCOUNTER — Ambulatory Visit (HOSPITAL_COMMUNITY)
Admission: RE | Admit: 2017-04-06 | Discharge: 2017-04-06 | Disposition: A | Discharge status: Home / Self Care | Payer: Federal, State, Local not specified - PPO | Source: Ambulatory Visit | Attending: Nurse Practitioner | Admitting: Nurse Practitioner

## 2017-04-06 ENCOUNTER — Encounter (HOSPITAL_COMMUNITY): Payer: Self-pay | Admitting: Nurse Practitioner

## 2017-04-06 VITALS — BP 136/76 | HR 71 | Ht 62.75 in | Wt 126.0 lb

## 2017-04-06 DIAGNOSIS — I480 Paroxysmal atrial fibrillation: Secondary | ICD-10-CM | POA: Insufficient documentation

## 2017-04-06 DIAGNOSIS — Z79899 Other long term (current) drug therapy: Secondary | ICD-10-CM | POA: Diagnosis not present

## 2017-04-06 DIAGNOSIS — Z7901 Long term (current) use of anticoagulants: Secondary | ICD-10-CM | POA: Diagnosis not present

## 2017-04-06 DIAGNOSIS — Z87891 Personal history of nicotine dependence: Secondary | ICD-10-CM | POA: Insufficient documentation

## 2017-04-06 DIAGNOSIS — Z888 Allergy status to other drugs, medicaments and biological substances status: Secondary | ICD-10-CM | POA: Insufficient documentation

## 2017-04-06 NOTE — Progress Notes (Signed)
Patient ID: Katrina Oliver, female   DOB: Feb 04, 1946, 71 y.o.   MRN: [id number]        Primary Care Physician: Dr. Shaune Pollackonna Gates Referring Physician: Same    Katrina Oliver is a 71 y.o. female with a h/o PAF that she first noticed very brief episodes that lasted 1-2 hours. She presented to the PCP office in March with an epiosode of irregular heart beat but by the time she got there, EKG showed SR. She presented to her PCP office yesterday and was found to have afib with rvr at 138 bpm. She was started on metoprolol and asked to be seen here today.  Now in SR.She does have a CHA2DS2VASc of 2  and would by guidelines be a candidate for anticoagulant. No bleeding issues.  Will stop asa. No previous echo.  Lifestyle issues reviewed and pt  states she is active at the gym, normal weight, no snoring history, no tobacco, does drink two alcoholic drinks a night.She was started on eliquis and echo was ordered.  She returns 09/17/15, no further afib, but is c/o fatigue probably from metoprolol. Echo showed normal structure heart. No bleeding issues with eliquis.  F/u 12/23/16- She reports that she is doing well. Only had one episode of irregular heart beat that resolved within one hour with an extra metoprolol in the last three months. Cutting dose of metoprolol helped with the feeling of fatigue. No issues with  DOAC.   F/u in afib clinic 12/12, she reports in 9 months, 2 episodes of afib both lasting less than 2 hours each, converted with extra dose of metoprolol.No issues with eliquis.  Today, she denies symptoms of palpitations, chest pain, shortness of breath, orthopnea, PND, lower extremity edema, dizziness, presyncope, syncope, or neurologic sequela. The patient is tolerating medications without difficulties and is otherwise without complaint today.   History reviewed. No pertinent past medical history.   Current Outpatient Medications  Medication Sig Dispense Refill  . calcium  carbonate (OSCAL) 1500 (600 Ca) MG TABS tablet Take 600 mg of elemental calcium by mouth daily with breakfast.    . cetirizine (ZYRTEC) 10 MG tablet Take 10 mg by mouth daily.    . cholecalciferol (VITAMIN D) 1000 units tablet Take 1,000 Units by mouth daily.    Marland Kitchen. ELIQUIS 5 MG TABS tablet TAKE 1 TABLET TWICE A DAY 180 tablet 3  . Estradiol (VAGIFEM) 10 MCG TABS vaginal tablet Place 1 tablet vaginally 2 (two) times a week.    . fluticasone (FLONASE) 50 MCG/ACT nasal spray Place 1 spray into both nostrils as needed for allergies or rhinitis.    . metoprolol tartrate (LOPRESSOR) 25 MG tablet TAKE 1/2 TABLET (12.5 MG   TOTAL) TWO TIMES A DAY 90 tablet 3  . Multiple Vitamin (MULTIVITAMIN) tablet Take 1 tablet by mouth daily.    Marland Kitchen. olopatadine (PATANOL) 0.1 % ophthalmic solution Place 1 drop into both eyes as needed for allergies.    Marland Kitchen. omega-3 acid ethyl esters (LOVAZA) 1 g capsule Take 1 g by mouth daily.     . ranitidine (ZANTAC) 75 MG tablet Take 75 mg by mouth as needed for heartburn.     No current facility-administered medications for this encounter.     Allergies  Allergen Reactions  . Other     alloway eye drops    Social History   Socioeconomic History  . Marital status: Single    Spouse name: Not on file  . Number of children: Not  on file  . Years of education: Not on file  . Highest education level: Not on file  Social Needs  . Financial resource strain: Not on file  . Food insecurity - worry: Not on file  . Food insecurity - inability: Not on file  . Transportation needs - medical: Not on file  . Transportation needs - non-medical: Not on file  Occupational History  . Not on file  Tobacco Use  . Smoking status: Former Games developermoker  . Smokeless tobacco: Never Used  Substance and Sexual Activity  . Alcohol use: Not on file  . Drug use: Not on file  . Sexual activity: Not on file  Other Topics Concern  . Not on file  Social History Narrative  . Not on file    No family  history on file.  ROS- All systems are reviewed and negative except as per the HPI above  Physical Exam: Vitals:   04/06/17 1127  BP: 136/76  Pulse: 71  Weight: 126 lb (57.2 kg)  Height: 5' 2.75" (1.594 m)    GEN- The patient is well appearing, alert and oriented x 3 today.   Head- normocephalic, atraumatic Eyes-  Sclera clear, conjunctiva pink Ears- hearing intact Oropharynx- clear Neck- supple, no JVP Lymph- no cervical lymphadenopathy Lungs- Clear to ausculation bilaterally, normal work of breathing Heart- Regular rate and rhythm, no murmurs, rubs or gallops, PMI not laterally displaced GI- soft, NT, ND, + BS Extremities- no clubbing, cyanosis, or edema MS- no significant deformity or atrophy Skin- no rash or lesion Psych- euthymic mood, full affect Neuro- strength and sensation are intact  EKG- NSR at 71 bpm, with pr int 120 ms, qrs int 70 ms, qtc 419 ms Labs-5/10 PCP labs creatinine 0.77, bun 16, K+ 4.7 Echo-Study Conclusions  - Left ventricle: The cavity size was normal. Systolic function was   normal. The estimated ejection fraction was in the range of 55%   to 60%. Wall motion was normal; there were no regional wall   motion abnormalities. Left ventricular diastolic function   parameters were normal. - Mitral valve: There was mild regurgitation.  Assessment and Plan: 1. PAF with low af burden Continue metoprolol 25 mg 1/2 bid  Can take an extra 1/2 tab of metoprolol for breakthrough episodes   2. Chadsvasc score of 2  No bleeding history Continue eliquis 5 mg bid, creatinine  0.8 in 08/2016 from PCP office, and with age under 6180 with weight at 57 kg, is on appropriate dose  No bleeding issues Try to avoid NSAIDS on blood thinner   3. Lifestyle factors that trigger afib Decrease alcohol to no more than 2 drinks a week Continue exercise routine, normal weight Denies snoring history    F/u one year in afib [id number]  Lupita LeashDonna C. Matthew Folksarroll, ANP-C Afib  Clinic [facility] [address] [place], [place] [address] [phone]

## 2017-04-15 NOTE — Addendum Note (Signed)
Encounter addended by: Newman Niparroll, Guneet Delpino C, NP on: 04/15/2017 2:53 PM  Actions taken: LOS modified

## 2017-08-04 ENCOUNTER — Other Ambulatory Visit (HOSPITAL_COMMUNITY): Payer: Self-pay | Admitting: Nurse Practitioner

## 2018-04-06 ENCOUNTER — Ambulatory Visit (HOSPITAL_COMMUNITY)
Admission: RE | Admit: 2018-04-06 | Discharge: 2018-04-06 | Disposition: A | Discharge status: Home / Self Care | Payer: Federal, State, Local not specified - PPO | Source: Ambulatory Visit | Attending: Nurse Practitioner | Admitting: Nurse Practitioner

## 2018-04-06 VITALS — BP 142/78 | HR 69 | Ht 62.75 in | Wt 125.6 lb

## 2018-04-06 DIAGNOSIS — I480 Paroxysmal atrial fibrillation: Secondary | ICD-10-CM

## 2018-04-06 DIAGNOSIS — Z87891 Personal history of nicotine dependence: Secondary | ICD-10-CM | POA: Diagnosis not present

## 2018-04-06 DIAGNOSIS — Z7901 Long term (current) use of anticoagulants: Secondary | ICD-10-CM | POA: Insufficient documentation

## 2018-04-06 DIAGNOSIS — Z79899 Other long term (current) drug therapy: Secondary | ICD-10-CM | POA: Insufficient documentation

## 2018-04-06 NOTE — Progress Notes (Addendum)
Patient ID: Katrina Oliver, female   DOB: Sep 05, 1945, 72 y.o.   MRN: [id number]        Primary Care Physician: Dr. Shaune Pollackonna Gates Referring Physician: Same    Katrina Oliver is a 72 y.o. female with a h/o PAF that she first noticed very brief episodes that lasted 1-2 hours. She presented to the PCP office in March with an epiosode of irregular heart beat but by the time she got there, EKG showed SR. She presented to her PCP office yesterday and was found to have afib with rvr at 138 bpm. She was started on metoprolol and asked to be seen here today.  Now in SR.She does have a CHA2DS2VASc of 2  and would by guidelines be a candidate for anticoagulant. No bleeding issues.  Will stop asa. No previous echo.  Lifestyle issues reviewed and pt  states she is active at the gym, normal weight, no snoring history, no tobacco, does drink two alcoholic drinks a night.She was started on eliquis and echo was ordered.  She returns 09/17/15, no further afib, but is c/o fatigue probably from metoprolol. Echo showed normal structure heart. No bleeding issues with eliquis.  F/u 12/23/16- She reports that she is doing well. Only had one episode of irregular heart beat that resolved within one hour with an extra metoprolol in the last three months. Cutting dose of metoprolol helped with the feeling of fatigue. No issues with  DOAC.   F/u in afib clinic 12/12, she reports in 9 months, 2 episodes of afib both lasting less than 2 hours each, converted with extra dose of metoprolol.No issues with eliquis.  F/u in afib clinic, 12/12.  She has done well over the last year. Has had 2 episodes of afib lasted less than 2 hours. Usually  Takes an extra metoprolol for this. Doing well on her DOAC, no signsof bleeding. WIll obtain labs(CBC/BMET) drawn with PCP this past June for review.  Today, she denies symptoms of palpitations, chest pain, shortness of breath, orthopnea, PND, lower extremity edema, dizziness,  presyncope, syncope, or neurologic sequela. The patient is tolerating medications without difficulties and is otherwise without complaint today.   No past medical history on file.   Current Outpatient Medications  Medication Sig Dispense Refill  . calcium carbonate (OSCAL) 1500 (600 Ca) MG TABS tablet Take 600 mg of elemental calcium by mouth daily with breakfast.    . calcium carbonate (TITRALAC) 420 MG CHEW chewable tablet Chew 420 mg by mouth.    . cholecalciferol (VITAMIN D) 1000 units tablet Take 1,000 Units by mouth daily.    Marland Kitchen. ELIQUIS 5 MG TABS tablet TAKE 1 TABLET TWICE A DAY 180 tablet 3  . Estradiol (VAGIFEM) 10 MCG TABS vaginal tablet Place 1 tablet vaginally 2 (two) times a week.    . fluticasone (FLONASE) 50 MCG/ACT nasal spray Place 1 spray into both nostrils as needed for allergies or rhinitis.    . metoprolol tartrate (LOPRESSOR) 25 MG tablet TAKE 1/2 TABLET (12.5 MG   TOTAL) TWO TIMES A DAY 90 tablet 3  . Multiple Vitamin (MULTIVITAMIN) tablet Take 1 tablet by mouth daily.    Marland Kitchen. olopatadine (PATANOL) 0.1 % ophthalmic solution Place 1 drop into both eyes as needed for allergies.    Marland Kitchen. omega-3 acid ethyl esters (LOVAZA) 1 g capsule Take 1 g by mouth daily.     . fexofenadine (ALLEGRA) 180 MG tablet Take 180 mg by mouth daily.     No current  facility-administered medications for this encounter.     Allergies  Allergen Reactions  . Other     alloway eye drops    Social History   Socioeconomic History  . Marital status: Single    Spouse name: Not on file  . Number of children: Not on file  . Years of education: Not on file  . Highest education level: Not on file  Occupational History  . Not on file  Social Needs  . Financial resource strain: Not on file  . Food insecurity:    Worry: Not on file    Inability: Not on file  . Transportation needs:    Medical: Not on file    Non-medical: Not on file  Tobacco Use  . Smoking status: Former Games developer  . Smokeless  tobacco: Never Used  Substance and Sexual Activity  . Alcohol use: Not on file  . Drug use: Not on file  . Sexual activity: Not on file  Lifestyle  . Physical activity:    Days per week: Not on file    Minutes per session: Not on file  . Stress: Not on file  Relationships  . Social connections:    Talks on phone: Not on file    Gets together: Not on file    Attends religious service: Not on file    Active member of club or organization: Not on file    Attends meetings of clubs or organizations: Not on file    Relationship status: Not on file  . Intimate partner violence:    Fear of current or ex partner: Not on file    Emotionally abused: Not on file    Physically abused: Not on file    Forced sexual activity: Not on file  Other Topics Concern  . Not on file  Social History Narrative  . Not on file    No family history on file.  ROS- All systems are reviewed and negative except as per the HPI above  Physical Exam: Vitals:   04/06/18 1049  BP: (!) 142/78  Pulse: 69  Weight: 57 kg  Height: 5' 2.75" (1.594 m)    GEN- The patient is well appearing, alert and oriented x 3 today.   Head- normocephalic, atraumatic Eyes-  Sclera clear, conjunctiva pink Ears- hearing intact Oropharynx- clear Neck- supple, no JVP Lymph- no cervical lymphadenopathy Lungs- Clear to ausculation bilaterally, normal work of breathing Heart- Regular rate and rhythm, no murmurs, rubs or gallops, PMI not laterally displaced GI- soft, NT, ND, + BS Extremities- no clubbing, cyanosis, or edema MS- no significant deformity or atrophy Skin- no rash or lesion Psych- euthymic mood, full affect Neuro- strength and sensation are intact  EKG- NSR at 69 bpm, with pr int 123 ms, qrs int 64 ms, qtc 409 ms Labs-from PCP this past June requested for reeview Echo-Study Conclusions  - Left ventricle: The cavity size was normal. Systolic function was   normal. The estimated ejection fraction was in the  range of 55%   to 60%. Wall motion was normal; there were no regional wall   motion abnormalities. Left ventricular diastolic function   parameters were normal. - Mitral valve: There was mild regurgitation.  Assessment and Plan: 1. PAF with very low AF burden Continue metoprolol 25 mg 1/2 bid  Can take an extra 1/2 tab of metoprolol for breakthrough episodes  2. Chadsvasc score of 2  No bleeding history Continue eliquis 5 mg bid  No bleeding issues Try to  avoid NSAIDS on blood thinner  3. Lifestyle factors that trigger afib Limit alcohol to no more than 2 drinks a week Continue exercise routine, normal weight Denies snoring history    F/u one year in afib clinic   Addendum: 04/12/18, labs received from PCP, dated 10/03/17 Creatinine at 0.74, bmet otherwise unremarkable,no dose adjustment is needed with eliquis  Lupita Leash C. Matthew Folks Afib Clinic [facility] [address] [place], [place] [phone]

## 2018-04-12 NOTE — Addendum Note (Signed)
Encounter addended by: Newman Niparroll, Aneri Slagel C, NP on: 04/12/2018 1:22 PM  Actions taken: Clinical Note Signed

## 2018-08-19 ENCOUNTER — Other Ambulatory Visit (HOSPITAL_COMMUNITY): Payer: Self-pay | Admitting: Nurse Practitioner

## 2018-09-11 ENCOUNTER — Other Ambulatory Visit (HOSPITAL_COMMUNITY): Payer: Self-pay | Admitting: Nurse Practitioner

## 2018-10-19 ENCOUNTER — Encounter: Payer: Self-pay | Admitting: Physical Therapy

## 2018-10-19 ENCOUNTER — Other Ambulatory Visit: Payer: Self-pay

## 2018-10-19 ENCOUNTER — Ambulatory Visit: Payer: Federal, State, Local not specified - PPO | Attending: Family Medicine | Admitting: Physical Therapy

## 2018-10-19 DIAGNOSIS — M6281 Muscle weakness (generalized): Secondary | ICD-10-CM | POA: Diagnosis present

## 2018-10-19 DIAGNOSIS — M25662 Stiffness of left knee, not elsewhere classified: Secondary | ICD-10-CM | POA: Insufficient documentation

## 2018-10-19 DIAGNOSIS — R2689 Other abnormalities of gait and mobility: Secondary | ICD-10-CM | POA: Diagnosis present

## 2018-10-19 DIAGNOSIS — M25562 Pain in left knee: Secondary | ICD-10-CM

## 2018-10-19 NOTE — Therapy (Signed)
[facility] [address], [address] [place], [place], [address] Phone: [phone]   Fax:  [phone]  Physical Therapy Evaluation  Patient Details  Name: Katrina PiggsJacqueline T Ligman MRN: [id number] Date of Birth: 10/03/1945 Referring Provider (PT): Dr. Shirlean Mylararol Webb   Encounter Date: 10/19/2018  PT End of Session - 10/19/18 1220    Visit Number  1    Date for PT Re-Evaluation  12/14/18    Authorization Type  BCBS federal    PT Start Time  1100    PT Stop Time  1135    PT Time Calculation (min)  35 min    Activity Tolerance  Patient tolerated treatment well;No increased pain    Behavior During Therapy  [facility] for tasks assessed/performed       History reviewed. No pertinent past medical history.  Past Surgical History:  Procedure Laterality Date  . RIGHT OOPHORECTOMY    . TUBAL LIGATION      There were no vitals filed for this visit.   Subjective Assessment - 10/19/18 1107    Subjective  Patient reports her left knee has been bothering her for the last 3 months.    Patient Stated Goals  reduce pain, keep doing exercise    Currently in Pain?  Yes    Pain Score  7    low 1/10   Pain Location  Knee    Pain Orientation  Left    Pain Descriptors / Indicators  Aching    Pain Type  Acute pain    Pain Onset  More than a month ago    Pain Frequency  Intermittent    Aggravating Factors   sit down after exercise    Pain Relieving Factors  heat         [facility] PT Assessment - 10/19/18 0001      Assessment   Medical Diagnosis  M76.52 Patellar tendonitis of left knee    Referring Provider (PT)  Dr. Shirlean Mylararol Webb    Onset Date/Surgical Date  07/19/18      Precautions   Precautions  None      Restrictions   Weight Bearing Restrictions  No      Balance Screen   Has the patient fallen in the past 6 months  No    Has the patient had a decrease in activity level because of a fear of falling?   No    Is the patient reluctant to  leave their home because of a fear of falling?   No      Home Public house managernvironment   Living Environment  Private residence      Prior Function   Level of Independence  Independent    Leisure  exercise daily      Cognition   Overall Cognitive Status  Within Functional Limits for tasks assessed      Posture/Postural Control   Posture/Postural Control  No significant limitations      ROM / Strength   AROM / PROM / Strength  AROM;PROM;Strength      PROM   Left Hip Flexion  110    Left Knee Extension  -5      Strength   Left Hip Flexion  4/5    Left Hip Extension  4/5    Left Hip External Rotation  4/5    Left Hip Internal Rotation  4/5    Left Hip ABduction  4/5    Left Knee Flexion  4/5  Left Knee Extension  4-/5      Ambulation/Gait   Stairs  Yes    Stairs Assistance  6: Modified independent (Device/Increase time)    Stair Management Technique  Step to pattern                Objective measurements completed on examination: See above findings.      [facility] Adult PT Treatment/Exercise - 10/19/18 0001      Lumbar Exercises: Stretches   Active Hamstring Stretch  Left;1 rep;30 seconds   with strap supine   Active Hamstring Stretch Limitations  pulled leg inward 30 sec then outward 30 seconds    Gastroc Stretch  Left;1 rep;30 seconds      Knee/Hip Exercises: Supine   Quad Sets  Strengthening;Left;1 set;10 reps   hold 5 sec            PT Education - 10/19/18 1123    Education Details  Access Code: [email or web address]    Person(s) Educated  Patient    Methods  Explanation;Demonstration;Handout    Comprehension  Verbalized understanding;Returned demonstration       PT Short Term Goals - 10/19/18 1231      PT SHORT TERM GOAL #1   Title  independent with initial HEP    Time  4    Period  Weeks    Status  New    Target Date  11/16/18      PT SHORT TERM GOAL #2   Title  pain in left knee after exercise decreased >/= 25% due to improve mobility and strength     Time  4    Period  Weeks    Status  New    Target Date  11/16/18        PT Long Term Goals - 10/19/18 1124      PT LONG TERM GOAL #1   Title  independent with HEP and understand how to progress herself    Time  8    Period  Weeks    Status  New    Target Date  12/14/18      PT LONG TERM GOAL #2   Title  go up and down stairs with step over step with minimal difficulty    Time  8    Period  Weeks    Status  New    Target Date  12/14/18      PT LONG TERM GOAL #3   Title  able to exercise at the gym with knee pain afterwards no more than 1-2/10    Time  8    Period  Weeks    Status  New    Target Date  12/14/18      PT LONG TERM GOAL #4   Title  left knee extension 0 degrees wo she is able to walk and exercise with minimal to no pain    Time  8    Period  Weeks    Status  New    Target Date  12/14/18      PT LONG TERM GOAL #5   Title  FOTO score </= 18%    Time  8    Period  Weeks    Status  New    Target Date  12/14/18             Plan - 10/19/18 1222    Clinical Impression Statement  Patient is a 73 year old female with acute left knee pain at levle  6/10 intermittently. Patient reports sudden onset of pain. Patient left knee extension is -5 degrees. Left knee and hip strength is 4/5. Patient has palpable tenderness along the lateral knee joint and patella tendon. Patient has tight hip flexors and gastroc. Patient has to go up and down steps with a step to step pattern. Patient ambulates with decreased left knee extension. Patient pain is worse with stairs, after exercise. Patient will benefit from skilled therapy to reduce left knee pain and increase strength to resume her activities.    Personal Factors and Comorbidities  Comorbidity 1;Fitness    Comorbidities  osteopenia    Examination-Activity Limitations  Locomotion Level;Stairs    Stability/Clinical Decision Making  Stable/Uncomplicated    Clinical Decision Making  Low    Rehab Potential  Excellent     PT Frequency  2x / week    PT Duration  8 weeks    PT Treatment/Interventions  Cryotherapy;Electrical Stimulation;Iontophoresis 4mg /ml Dexamethasone;Moist Heat;Ultrasound;Therapeutic exercise;Therapeutic activities;Gait training;Stair training;Neuromuscular re-education;Patient/family education;Passive range of motion;Dry needling;Manual techniques;Taping;Joint Manipulations    PT Next Visit Plan  work on left knee extension, hip strength for HEP, flexibility for hamstring, gastroc, hip flexor, ionto if MD signes note    PT Home Exercise Plan  Access Code: [facility]       Patient will benefit from skilled therapeutic intervention in order to improve the following deficits and impairments:  Abnormal gait, Decreased range of motion, Decreased endurance, Decreased activity tolerance, Pain, Impaired flexibility, Decreased strength  Visit Diagnosis: 1. Acute pain of left knee   2. Stiffness of left knee, not elsewhere classified   3. Muscle weakness (generalized)   4. Other abnormalities of gait and mobility        Problem List There are no active problems to display for this patient.   Earlie Counts, PT 10/19/18 12:35 PM   [facility] [address] [place], [place], [address] Phone: [phone]   Fax:  [phone]  Name: TEASHA MURRILLO MRN: [id number] Date of Birth: 29-Apr-1945

## 2018-10-19 NOTE — Patient Instructions (Signed)
Access Code: [email or web address]  URL: [email or web address]  Date: 10/19/2018  Prepared by: Earlie Counts   Exercises Hooklying Hamstring Stretch with Strap - 2 reps - 1 sets - 30 sec hold - 1x daily - 7x weekly Gastroc Stretch on Wall - 2 reps - 1 sets - 30 sec hold - 1x daily - 7x weekly Long Sitting Quad Set - 10 reps - 1 sets - 5 sec hold - 1x daily - 7x weekly Quadriceps Stretch with Chair - 2 reps - 1 sets - 30 sec hold - 1x daily - 7x weekly [facility] [address], [place], [place] [address] Phone # [phone] Fax [phone]

## 2018-10-25 ENCOUNTER — Ambulatory Visit: Payer: Federal, State, Local not specified - PPO | Attending: Family Medicine | Admitting: Physical Therapy

## 2018-10-25 ENCOUNTER — Other Ambulatory Visit: Payer: Self-pay

## 2018-10-25 DIAGNOSIS — M25662 Stiffness of left knee, not elsewhere classified: Secondary | ICD-10-CM

## 2018-10-25 DIAGNOSIS — M6281 Muscle weakness (generalized): Secondary | ICD-10-CM | POA: Diagnosis present

## 2018-10-25 DIAGNOSIS — R2689 Other abnormalities of gait and mobility: Secondary | ICD-10-CM

## 2018-10-25 DIAGNOSIS — M25562 Pain in left knee: Secondary | ICD-10-CM

## 2018-10-25 NOTE — Therapy (Signed)
[facility] [address], [address] [place], [place], [address] Phone: [phone]   Fax:  [phone]  Physical Therapy Treatment  Patient Details  Name: Katrina Oliver MRN: [id number] Date of Birth: 1945/08/24 Referring Provider (PT): Dr. Shirlean Mylararol Webb   Encounter Date: 10/25/2018  PT End of Session - 10/25/18 1536    Visit Number  2    Date for PT Re-Evaluation  12/14/18    Authorization Type  BCBS federal    PT Start Time  1532    PT Stop Time  1615    PT Time Calculation (min)  43 min    Activity Tolerance  Patient tolerated treatment well;No increased pain    Behavior During Therapy  [facility] assessed/[facility]       No past medical history on file.  Past Surgical History:  Procedure Laterality Date  . RIGHT OOPHORECTOMY    . TUBAL LIGATION      There were no vitals filed for this visit.  Subjective Assessment - 10/25/18 1534    Subjective  Pt states there is no pain currently.  State she is feeling better with the stretches so far.    Patient Stated Goals  reduce pain, keep doing exercise    Currently in Pain?  No/denies                       [facility]/Ex[facility] - 10/25/18 0001      Lumbar Exercises: Stretches   Active Hamstring Stretch  Left;1 rep;30 seconds   standing at steps   Hip Flexor Stretch  Left;1 rep;30 seconds   standing at steps   Quad Stretch  Left;3 reps;20 seconds   prone with strap     Lumbar Exercises: Aerobic   Nustep  L1x3 min; L3 x 3 min   PT present for status update     Lumbar Exercises: Machines for Strengthening   Leg Press  80lb bilat seat 6; single 30lb Lt LE 10 reps      Lumbar Exercises: Supine   Clam  20 reps    Bridge  20 reps   red band   Straight Leg Raise  20 reps   with hip ER     Knee/Hip Exercises: Sidelying   Hip ABduction  Strengthening;Left;20 reps      Manual Therapy   Manual Therapy  Soft tissue mobilization    Soft tissue mobilization  vastus lateralis and quad tendon               PT Short Term Goals - 10/25/18 1537      PT SHORT TERM GOAL #1   Title  independent with initial HEP    Status  Achieved      PT SHORT TERM GOAL #2   Title  pain in left knee after exercise decreased >/= 25% due to improve mobility and strength    Baseline  30% better    Status  Achieved        PT Long Term Goals - 10/19/18 1124      PT LONG TERM GOAL #1   Title  independent with HEP and understand how to progress herself    Time  8    Period  Weeks    Status  New    Target Date  12/14/18      PT LONG TERM GOAL #2   Title  go up and down stairs with step over  step with minimal difficulty    Time  8    Period  Weeks    Status  New    Target Date  12/14/18      PT LONG TERM GOAL #3   Title  able to exercise at the gym with knee pain afterwards no more than 1-2/10    Time  8    Period  Weeks    Status  New    Target Date  12/14/18      PT LONG TERM GOAL #4   Title  left knee extension 0 degrees wo she is able to walk and exercise with minimal to no pain    Time  8    Period  Weeks    Status  New    Target Date  12/14/18      PT LONG TERM GOAL #5   Title  FOTO score </= 18%    Time  8    Period  Weeks    Status  New    Target Date  12/14/18            Plan - 10/25/18 1555    Clinical Impression Statement  Pt has achieved both short term goals.  Pt did well with treatment today.  She had muscle knots in lateral quadrucep on the left LE.  Muscle released with STM.  She demonstrates decreased knee ext on left LE.  Pt will benefit from skilled PT to continue to progress according to POC    PT Treatment/Interventions  Cryotherapy;Electrical Stimulation;Iontophoresis 4mg /ml Dexamethasone;Moist Heat;Ultrasound;Therapeutic exercise;Therapeutic activities;Gait training;Stair training;Neuromuscular re-education;Patient/family education;Passive range of motion;Dry needling;Manual  techniques;Taping;Joint Manipulations    PT Next Visit Plan  work on left knee extension, hip strength for HEP, flexibility for hamstring, gastroc, hip flexor, ionto if needed    PT Home Exercise Plan  Access Code: [email or web address]    [email or web address] and Agree with Plan of Care  Patient       Patient will benefit from skilled therapeutic intervention in order to improve the following deficits and impairments:  Abnormal gait, Decreased range of motion, Decreased endurance, Decreased activity tolerance, Pain, Impaired flexibility, Decreased strength  Visit Diagnosis: 1. Acute pain of left knee   2. Stiffness of left knee, not elsewhere classified   3. Muscle weakness (generalized)   4. Other abnormalities of gait and mobility        Problem List There are no active problems to display for this patient.   Camillo Flaming Daylen Hack, PT 10/25/2018, 5:01 PM  [facility] [address] [place], [place], [address] Phone: [phone]   Fax:  [phone]  Name: Katrina Oliver MRN: [id number] Date of Birth: Feb 08, 1946

## 2018-10-31 ENCOUNTER — Other Ambulatory Visit: Payer: Self-pay

## 2018-10-31 ENCOUNTER — Ambulatory Visit: Payer: Federal, State, Local not specified - PPO | Admitting: Physical Therapy

## 2018-10-31 DIAGNOSIS — R2689 Other abnormalities of gait and mobility: Secondary | ICD-10-CM

## 2018-10-31 DIAGNOSIS — M25562 Pain in left knee: Secondary | ICD-10-CM

## 2018-10-31 DIAGNOSIS — M6281 Muscle weakness (generalized): Secondary | ICD-10-CM

## 2018-10-31 DIAGNOSIS — M25662 Stiffness of left knee, not elsewhere classified: Secondary | ICD-10-CM

## 2018-10-31 NOTE — Therapy (Signed)
[facility] [address] [place], [place], [address] Phone: [phone]   Fax:  [phone]  Physical Therapy Treatment  Patient Details  Name: Katrina Oliver MRN: [id number] Date of Birth: [birth year] Referring Provider (PT): Dr. Maurice Small   Encounter Date: 10/31/2018  PT End of Session - 10/31/18 1431    Visit Number  3    Date for PT Re-Evaluation  12/14/18    Authorization Type  BCBS federal    PT Start Time  4818    PT Stop Time  1515    PT Time Calculation (min)  44 min    Activity Tolerance  Patient tolerated treatment well;No increased pain    Behavior During Therapy  [facility] for tasks assessed/performed       No past medical history on file.  Past Surgical History:  Procedure Laterality Date  . RIGHT OOPHORECTOMY    . TUBAL LIGATION      There were no vitals filed for this visit.  Subjective Assessment - 10/31/18 1435    Subjective  Pt states she has no pain today.  States the only thing that hurts is the hamstring stretch with the strap.  My pain is only lasting 10-15 minutes, it was about 45 minutes previously.  I found out I have osteoporosis.    Pertinent History  osteoporosis    Patient Stated Goals  reduce pain, keep doing exercise    Currently in Pain?  No/denies                       [facility] Adult PT Treatment/Exercise - 10/31/18 0001      Lumbar Exercises: Stretches   Active Hamstring Stretch  Left;30 seconds;3 reps   standing at steps   [employer] seconds;2 reps   prone with strap   Gastroc Stretch  Left;1 rep;30 seconds      Lumbar Exercises: Aerobic   Nustep  L2x3 min; L3 x 3 min   PT present for status update     Lumbar Exercises: Standing   Other Standing Lumbar Exercises  monster walks fwd and side - red band - 30 ft each      Lumbar Exercises: Supine   Bridge  20 reps      Knee/Hip Exercises: Supine   Short Arc Quad Sets  Strengthening;Left    30 reps   Short Arc Quad Sets Limitations  2lb      Knee/Hip Exercises: Sidelying   Hip ABduction  Strengthening;Left;20 reps      Manual Therapy   Soft tissue mobilization  vastus lateralis and quad tendon Lt LE             PT Education - 10/31/18 1516    Education Details  Access Code: [email or web address]    Person(s) Educated  Patient    Methods  Explanation;Demonstration;Handout;Verbal cues    Comprehension  Verbalized understanding;Returned demonstration       PT Short Term Goals - 10/25/18 1537      PT SHORT TERM GOAL #1   Title  independent with initial HEP    Status  Achieved      PT SHORT TERM GOAL #2   Title  pain in left knee after exercise decreased >/= 25% due to improve mobility and strength    Baseline  30% better    Status  Achieved        PT Long Term Goals - 10/19/18 1124  PT LONG TERM GOAL #1   Title  independent with HEP and understand how to progress herself    Time  8    Period  Weeks    Status  New    Target Date  12/14/18      PT LONG TERM GOAL #2   Title  go up and down stairs with step over step with minimal difficulty    Time  8    Period  Weeks    Status  New    Target Date  12/14/18      PT LONG TERM GOAL #3   Title  able to exercise at the gym with knee pain afterwards no more than 1-2/10    Time  8    Period  Weeks    Status  New    Target Date  12/14/18      PT LONG TERM GOAL #4   Title  left knee extension 0 degrees wo she is able to walk and exercise with minimal to no pain    Time  8    Period  Weeks    Status  New    Target Date  12/14/18      PT LONG TERM GOAL #5   Title  FOTO score </= 18%    Time  8    Period  Weeks    Status  New    Target Date  12/14/18            Plan - 10/31/18 1516    Clinical Impression Statement  Pt is doing better and reports she is not having pain for as long after she exercises.  Pt was able to progress strength today.  Pt needed some cues to keep knee in line with toes and  keep toes forward with side stepping.  Pt will benefit from skilled PT to continue to manage soft tissue adhesions while strengthening and increasing LE ROM.    PT Treatment/Interventions  Cryotherapy;Electrical Stimulation;Iontophoresis 4mg /ml Dexamethasone;Moist Heat;Ultrasound;Therapeutic exercise;Therapeutic activities;Gait training;Stair training;Neuromuscular re-education;Patient/family education;Passive range of motion;Dry needling;Manual techniques;Taping;Joint Manipulations    PT Next Visit Plan  left LE strength and knee extension, vastus lateralis STM, IT band taping if needed, ionto if needed    PT Home Exercise Plan  Access Code: [email or web address]    Consulted and Agree with Plan of Care  Patient       Patient will benefit from skilled therapeutic intervention in order to improve the following deficits and impairments:  Abnormal gait, Decreased range of motion, Decreased endurance, Decreased activity tolerance, Pain, Impaired flexibility, Decreased strength  Visit Diagnosis: 1. Acute pain of left knee   2. Stiffness of left knee, not elsewhere classified   3. Muscle weakness (generalized)   4. Other abnormalities of gait and mobility        Problem List There are no active problems to display for this patient.   Junious SilkJakki L Rahkeem Senft, PT 10/31/2018, 3:24 PM  [facility] [address] [place], [place], [address] Phone: [phone]   Fax:  [phone]  Name: Katrina Oliver MRN: [id number] Date of Birth: 1945-08-07

## 2018-10-31 NOTE — Patient Instructions (Signed)
Access Code: [email or web address]  URL: [email or web address]  Date: 10/31/2018  Prepared by: Jari Favre   Exercises  Hooklying Hamstring Stretch with Strap - 2 reps - 1 sets - 30 sec hold - 1x daily - 7x weekly  Gastroc Stretch on Wall - 2 reps - 1 sets - 30 sec hold - 1x daily - 7x weekly  Long Sitting Quad Set - 10 reps - 1 sets - 5 sec hold - 1x daily - 7x weekly  Quadriceps Stretch with Chair - 2 reps - 1 sets - 30 sec hold - 1x daily - 7x weekly  Sidelying Hip Abduction - 10 reps - 3 sets - 1x daily - 7x weekly  Straight Leg Raise with External Rotation - 10 reps - 3 sets - 1x daily - 7x weekly  Side Stepping with Resistance at Ankles - 10 reps - 3 sets - 1x daily - 7x weekly  Forward Monster Walks - 10 reps - 3 sets - 1x daily - 7x weekly

## 2018-11-02 ENCOUNTER — Encounter: Payer: Self-pay | Admitting: Physical Therapy

## 2018-11-02 ENCOUNTER — Ambulatory Visit: Payer: Federal, State, Local not specified - PPO | Admitting: Physical Therapy

## 2018-11-02 ENCOUNTER — Other Ambulatory Visit: Payer: Self-pay

## 2018-11-02 DIAGNOSIS — R2689 Other abnormalities of gait and mobility: Secondary | ICD-10-CM

## 2018-11-02 DIAGNOSIS — M25562 Pain in left knee: Secondary | ICD-10-CM

## 2018-11-02 DIAGNOSIS — M6281 Muscle weakness (generalized): Secondary | ICD-10-CM

## 2018-11-02 DIAGNOSIS — M25662 Stiffness of left knee, not elsewhere classified: Secondary | ICD-10-CM

## 2018-11-02 NOTE — Therapy (Signed)
[facility] [address], [place], [place], [address] Phone: [phone]   Fax:  [phone]  Physical Therapy Treatment  Patient Details  Name: Katrina Oliver MRN: [id number] Date of Birth: 07-Feb-1946 Referring Provider (PT): Dr. Maurice Small   Encounter Date: 11/02/2018  PT End of Session - 11/02/18 1224    Visit Number  4    Date for PT Re-Evaluation  12/14/18    Authorization Type  BCBS federal    PT Start Time  1227    PT Stop Time  4270    PT Time Calculation (min)  38 min    Activity Tolerance  Patient tolerated treatment well;No increased pain    Behavior During Therapy  [facility] for tasks assessed/performed       History reviewed. No pertinent past medical history.  Past Surgical History:  Procedure Laterality Date  . RIGHT OOPHORECTOMY    . TUBAL LIGATION      There were no vitals filed for this visit.  Subjective Assessment - 11/02/18 1224    Subjective  Pt states she had some pain  during the walk today but she stopped and stretched her hamstring and quad and then it was fine.    Pertinent History  osteoporosis    Patient Stated Goals  reduce pain, keep doing exercise    Currently in Pain?  No/denies                       [facility] Adult PT Treatment/Exercise - 11/02/18 0001      Lumbar Exercises: Stretches   Active Hamstring Stretch  Left;30 seconds;3 reps   standing at steps   [employer] seconds;2 reps   prone with strap   Gastroc Stretch  Left;1 rep;30 seconds      Lumbar Exercises: Aerobic   Nustep  L3 x 6 min   PT present for status update     Lumbar Exercises: Machines for Strengthening   Leg Press  80lb bilat seat 6; single 30lb Lt LE 2x10 reps      Lumbar Exercises: Standing   Other Standing Lumbar Exercises  monster walks fwd and side - red band - 30 ft each      Knee/Hip Exercises: Standing   Knee Flexion  Strengthening;Right;Left;10 reps;2  sets;Limitations    Knee Flexion Limitations  2lb    Hip Abduction  Stengthening;Right;Left;2 sets;10 reps;Limitations    Abduction Limitations  2lb    Other Standing Knee Exercises  bird dips - 10x each side - cues to do small ROM; occasional hand touch      Knee/Hip Exercises: Sidelying   Hip ABduction  Strengthening;Left;15 reps;Right    Hip ABduction Limitations  2lb    Hip ADduction  Strengthening;Right;Left;15 reps;Limitations    Hip ADduction Limitations  2lb               PT Short Term Goals - 10/25/18 1537      PT SHORT TERM GOAL #1   Title  independent with initial HEP    Status  Achieved      PT SHORT TERM GOAL #2   Title  pain in left knee after exercise decreased >/= 25% due to improve mobility and strength    Baseline  30% better    Status  Achieved        PT Long Term Goals - 11/02/18 1309      PT LONG TERM GOAL #1  Title  independent with HEP and understand how to progress herself    Status  On-going      PT LONG TERM GOAL #2   Title  go up and down stairs with step over step with minimal difficulty    Baseline  able to do step up/down no pain today    Status  On-going      PT LONG TERM GOAL #3   Title  able to exercise at the gym with knee pain afterwards no more than 1-2/10    Status  On-going      PT LONG TERM GOAL #4   Title  left knee extension 0 degrees wo she is able to walk and exercise with minimal to no pain    Baseline  no pain today    Status  On-going      PT LONG TERM GOAL #5   Title  FOTO score </= 18%    Status  On-going            Plan - 11/02/18 1310    Clinical Impression Statement  Pt reports walking without pain afterwards today.  Pt was able to progress exercises today.  She needed some cues to keep her knee alignment with hip.  She was a little sore from manual last time and did not need any STM today.  Pt will continue to benefit from skilled PT to address impairments for maximum return to functional  activities.    PT Treatment/Interventions  Cryotherapy;Electrical Stimulation;Iontophoresis 4mg /ml Dexamethasone;Moist Heat;Ultrasound;Therapeutic exercise;Therapeutic activities;Gait training;Stair training;Neuromuscular re-education;Patient/family education;Passive range of motion;Dry needling;Manual techniques;Taping;Joint Manipulations    PT Next Visit Plan  progress quad and hip strength, steps, STM as needed    PT Home Exercise Plan  Access Code: [email or web address]    Consulted and Agree with Plan of Care  Patient       Patient will benefit from skilled therapeutic intervention in order to improve the following deficits and impairments:  Abnormal gait, Decreased range of motion, Decreased endurance, Decreased activity tolerance, Pain, Impaired flexibility, Decreased strength  Visit Diagnosis: 1. Acute pain of left knee   2. Stiffness of left knee, not elsewhere classified   3. Muscle weakness (generalized)   4. Other abnormalities of gait and mobility        Problem List There are no active problems to display for this patient.   Brayton CavesJakki L Maneh Sieben, PT 11/02/2018, 1:16 PM  [facility] [address], [address] [place], [place], [address] Phone: [phone]   Fax:  [phone]  Name: Katrina Oliver MRN: [id number] Date of Birth: 1945/11/05

## 2018-11-07 ENCOUNTER — Ambulatory Visit: Payer: Federal, State, Local not specified - PPO | Admitting: Physical Therapy

## 2018-11-07 ENCOUNTER — Other Ambulatory Visit: Payer: Self-pay

## 2018-11-07 ENCOUNTER — Encounter: Payer: Self-pay | Admitting: Physical Therapy

## 2018-11-07 DIAGNOSIS — M25662 Stiffness of left knee, not elsewhere classified: Secondary | ICD-10-CM

## 2018-11-07 DIAGNOSIS — R2689 Other abnormalities of gait and mobility: Secondary | ICD-10-CM

## 2018-11-07 DIAGNOSIS — M25562 Pain in left knee: Secondary | ICD-10-CM

## 2018-11-07 DIAGNOSIS — M6281 Muscle weakness (generalized): Secondary | ICD-10-CM

## 2018-11-07 NOTE — Therapy (Signed)
[facility] [address], [address] [place], [place], [address] Phone: [phone]   Fax:  [phone]  Physical Therapy Treatment  Patient Details  Name: Katrina Oliver MRN: [id number] Date of Birth: 1945/12/13 Referring Provider (PT): Dr. Shirlean Mylararol Webb   Encounter Date: 11/07/2018  PT End of Session - 11/07/18 1234    Visit Number  5    Date for PT Re-Evaluation  12/14/18    Authorization Type  BCBS federal    PT Start Time  1230    PT Stop Time  1315    PT Time Calculation (min)  45 min    Activity Tolerance  Patient tolerated treatment well;No increased pain    Behavior During Therapy  [facility] for tasks assessed/performed       History reviewed. No pertinent past medical history.  Past Surgical History:  Procedure Laterality Date  . RIGHT OOPHORECTOMY    . TUBAL LIGATION      There were no vitals filed for this visit.  Subjective Assessment - 11/07/18 1233    Subjective  Pt states she is having no pain today.  She visited her granddaughter.  Pt states she feels 50-60% better    Patient Stated Goals  reduce pain, keep doing exercise    Currently in Pain?  No/denies                       [facility] Treatment/Exercise - 11/07/18 0001      Lumbar Exercises: Stretches   Active Hamstring Stretch  Left;30 seconds;3 reps   supine with strap   Quad Stretch  Left;20 seconds;2 reps   prone with strap     Lumbar Exercises: Aerobic   Nustep  L3 x 6 min   PT present for status update     Lumbar Exercises: Machines for Strengthening   Leg Press  85lb bilat seat 6 20x; single 30lb Lt LE 2x10 reps      Lumbar Exercises: Supine   Other Supine Lumbar Exercises  standing with green band pallof press while marching with Rt LE      Knee/Hip Exercises: Standing   Knee Flexion  Strengthening;Right;Left;10 reps;2 sets;Limitations    Knee Flexion Limitations  2lb    Hip Abduction  Stengthening;Right;Left;2  sets;10 reps;Limitations    Abduction Limitations  green band around thigh    Hip Extension  Stengthening;Right;Left;2 sets;10 reps;Knee straight    Extension Limitations  green band around thigh    Lateral Step Up  Left;10 reps;Step Height: 6";Hand Hold: 1    Step Down  Left;20 reps;Hand Hold: 0;Step Height: 2"    Step Down Limitations  cues to keep knee aligned with middle toe      Knee/Hip Exercises: Sidelying   Hip ABduction  Strengthening;Left;15 reps;Right    Hip ABduction Limitations  2lb    Hip ADduction  Strengthening;Right;Left;15 reps;Limitations    Hip ADduction Limitations  2lb      Manual Therapy   Soft tissue mobilization  vastus lateralis and quad tendon Lt LE               PT Short Term Goals - 10/25/18 1537      PT SHORT TERM GOAL #1   Title  independent with initial HEP    Status  Achieved      PT SHORT TERM GOAL #2   Title  pain in left knee after exercise decreased >/= 25% due to improve mobility and  strength    Baseline  30% better    Status  Achieved        PT Long Term Goals - 11/07/18 1322      PT LONG TERM GOAL #1   Title  independent with HEP and understand how to progress herself    Status  On-going      PT LONG TERM GOAL #2   Title  go up and down stairs with step over step with minimal difficulty    Baseline  able to do step up/down no pain today    Status  On-going      PT LONG TERM GOAL #3   Title  able to exercise at the gym with knee pain afterwards no more than 1-2/10    Status  On-going      PT LONG TERM GOAL #4   Title  left knee extension 0 degrees wo she is able to walk and exercise with minimal to no pain    Baseline  no pain today    Status  On-going      PT LONG TERM GOAL #5   Title  FOTO score </= 18%    Status  On-going            Plan - 11/07/18 1318    Clinical Impression Statement  Pt did well today and no pain throughout treatment.  She was able to increase resistance.  Less trigger points and  tenderness were felt on left quad today.  Pt reports she is overall at least 50% better since starting PT.  She will continue to benefit from skilled PT to achieve maximum function.    PT Treatment/Interventions  Cryotherapy;Electrical Stimulation;Iontophoresis 4mg /ml Dexamethasone;Moist Heat;Ultrasound;Therapeutic exercise;Therapeutic activities;Gait training;Stair training;Neuromuscular re-education;Patient/family education;Passive range of motion;Dry needling;Manual techniques;Taping;Joint Manipulations    PT Next Visit Plan  progress quad and hip strength, steps, STM as needed    PT Home Exercise Plan  Access Code: [email or web address]    Consulted and Agree with Plan of Care  Patient       Patient will benefit from skilled therapeutic intervention in order to improve the following deficits and impairments:  Abnormal gait, Decreased range of motion, Decreased endurance, Decreased activity tolerance, Pain, Impaired flexibility, Decreased strength  Visit Diagnosis: 1. Acute pain of left knee   2. Stiffness of left knee, not elsewhere classified   3. Muscle weakness (generalized)   4. Other abnormalities of gait and mobility        Problem List There are no active problems to display for this patient.   Jule Ser, PT 11/07/2018, 1:23 PM  [facility] [address], [place], [place], [address] Phone: [phone]   Fax:  [phone]  Name: Katrina Oliver MRN: [id number] Date of Birth: Aug 23, 1945

## 2018-11-09 ENCOUNTER — Other Ambulatory Visit: Payer: Self-pay

## 2018-11-09 ENCOUNTER — Ambulatory Visit: Payer: Federal, State, Local not specified - PPO | Admitting: Physical Therapy

## 2018-11-09 ENCOUNTER — Encounter: Payer: Self-pay | Admitting: Physical Therapy

## 2018-11-09 DIAGNOSIS — M25662 Stiffness of left knee, not elsewhere classified: Secondary | ICD-10-CM

## 2018-11-09 DIAGNOSIS — M6281 Muscle weakness (generalized): Secondary | ICD-10-CM

## 2018-11-09 DIAGNOSIS — M25562 Pain in left knee: Secondary | ICD-10-CM

## 2018-11-09 DIAGNOSIS — R2689 Other abnormalities of gait and mobility: Secondary | ICD-10-CM

## 2018-11-09 NOTE — Therapy (Signed)
[facility] [address], [place], [place], [address] Phone: [phone]   Fax:  [phone]  Physical Therapy Treatment  Patient Details  Name: Katrina Oliver MRN: [id number] Date of Birth: May 31, 1945 Referring Provider (PT): Dr. Maurice Small   Encounter Date: 11/09/2018  PT End of Session - 11/09/18 1440    Visit Number  6    Date for PT Re-Evaluation  12/14/18    Authorization Type  BCBS federal    PT Start Time  1400    PT Stop Time  1438    PT Time Calculation (min)  38 min    Activity Tolerance  Patient tolerated treatment well;No increased pain    Behavior During Therapy  [facility] for tasks assessed/performed       History reviewed. No pertinent past medical history.  Past Surgical History:  Procedure Laterality Date  . RIGHT OOPHORECTOMY    . TUBAL LIGATION      There were no vitals filed for this visit.  Subjective Assessment - 11/09/18 1403    Subjective  My knee feels 70% better. I get a twinge but no ache. I still feel weakness in the leg when going upstairs. I have been doing the classess at the gym without difficulty.    Pertinent History  osteoporosis    Patient Stated Goals  reduce pain, keep doing exercise    Currently in Pain?  No/denies         [facility] PT Assessment - 11/09/18 0001      PROM   Left Hip Flexion  120    Left Knee Extension  0   A/ROM -2 degrees                  [facility] Adult PT Treatment/Exercise - 11/09/18 0001      Lumbar Exercises: Stretches   Active Hamstring Stretch  Left;30 seconds;3 reps   supine with strap   Quad Stretch  Left;20 seconds;2 reps   prone with strap     Lumbar Exercises: Aerobic   Nustep  L3 x 6 min, seat # 5, arm #9   PT present for status update     Lumbar Exercises: Machines for Strengthening   Leg Press  85lb bilat seat 6 20x; single 30lb Lt LE 2x10 reps      Knee/Hip Exercises: Standing   Forward Lunges  Left;Right;1  set;10 reps   10 times bringing the red band across the body   Forward Step Up  Left;1 set;15 reps   on round side of BOSU ball   Step Down  Left;1 set;15 reps;Hand Hold: 1;Step Height: 4"    Other Standing Knee Exercises  single leg stance on left with bending forward 10x    Other Standing Knee Exercises  squat with pulling on red band 10 times with VC to fully straighten the left knee      Knee/Hip Exercises: Supine   Quad Sets  Strengthening;Left;1 set;10 reps   with therapist giving over pressure to work on extension     Knee/Hip Exercises: Sidelying   Hip ABduction  Strengthening;Left;15 reps;Right    Hip ABduction Limitations  2lb               PT Short Term Goals - 10/25/18 1537      PT SHORT TERM GOAL #1   Title  independent with initial HEP    Status  Achieved      PT SHORT TERM GOAL #2  Title  pain in left knee after exercise decreased >/= 25% due to improve mobility and strength    Baseline  30% better    Status  Achieved        PT Long Term Goals - 11/07/18 1322      PT LONG TERM GOAL #1   Title  independent with HEP and understand how to progress herself    Status  On-going      PT LONG TERM GOAL #2   Title  go up and down stairs with step over step with minimal difficulty    Baseline  able to do step up/down no pain today    Status  On-going      PT LONG TERM GOAL #3   Title  able to exercise at the gym with knee pain afterwards no more than 1-2/10    Status  On-going      PT LONG TERM GOAL #4   Title  left knee extension 0 degrees wo she is able to walk and exercise with minimal to no pain    Baseline  no pain today    Status  On-going      PT LONG TERM GOAL #5   Title  FOTO score </= 18%    Status  On-going            Plan - 11/09/18 1441    Clinical Impression Statement  Patient has increased left hip flexion and knee extension. Patient was working on closed chain knee exercises that incorporate the hip to strengthen on the  stairs. Patient reports her left knee pain is 70% better. She feels the weakness with steps. Patient will benefit from skilled therapy to improve strength and knee extension for full function.    Personal Factors and Comorbidities  Comorbidity 1;Fitness    Comorbidities  osteopenia    Examination-Activity Limitations  Locomotion Level;Stairs    Stability/Clinical Decision Making  Stable/Uncomplicated    Rehab Potential  Excellent    PT Frequency  2x / week    PT Duration  8 weeks    PT Treatment/Interventions  Cryotherapy;Electrical Stimulation;Iontophoresis 4mg /ml Dexamethasone;Moist Heat;Ultrasound;Therapeutic exercise;Therapeutic activities;Gait training;Stair training;Neuromuscular re-education;Patient/family education;Passive range of motion;Dry needling;Manual techniques;Taping;Joint Manipulations    PT Next Visit Plan  progress quad and hip strength using closed chain, steps, STM as needed    PT Home Exercise Plan  Access Code: [email or web address]    Recommended Other Services  MD signed initial eval    Consulted and Agree with Plan of Care  Patient       Patient will benefit from skilled therapeutic intervention in order to improve the following deficits and impairments:  Abnormal gait, Decreased range of motion, Decreased endurance, Decreased activity tolerance, Pain, Impaired flexibility, Decreased strength  Visit Diagnosis: 1. Acute pain of left knee   2. Stiffness of left knee, not elsewhere classified   3. Muscle weakness (generalized)   4. Other abnormalities of gait and mobility        Problem List There are no active problems to display for this patient.   Eulis FosterCheryl Gray, PT 11/09/18 2:45 PM   [facility] [address], [address] [place], [place], [address] Phone: [phone]   Fax:  [phone]  Name: Mart PiggsJacqueline T Coonan MRN: [id number] Date of Birth: 15-Mar-1946

## 2018-11-14 ENCOUNTER — Encounter: Payer: Self-pay | Admitting: Physical Therapy

## 2018-11-14 ENCOUNTER — Ambulatory Visit: Payer: Federal, State, Local not specified - PPO | Admitting: Physical Therapy

## 2018-11-14 ENCOUNTER — Other Ambulatory Visit: Payer: Self-pay

## 2018-11-14 DIAGNOSIS — M25562 Pain in left knee: Secondary | ICD-10-CM | POA: Diagnosis not present

## 2018-11-14 DIAGNOSIS — M25662 Stiffness of left knee, not elsewhere classified: Secondary | ICD-10-CM

## 2018-11-14 DIAGNOSIS — M6281 Muscle weakness (generalized): Secondary | ICD-10-CM

## 2018-11-14 DIAGNOSIS — R2689 Other abnormalities of gait and mobility: Secondary | ICD-10-CM

## 2018-11-14 NOTE — Therapy (Signed)
[facility] [address], [address] [place], [place], [address] Phone: [phone]   Fax:  [phone]  Physical Therapy Treatment  Patient Details  Name: Mart PiggsJacqueline T Pandit MRN: [id number] Date of Birth: Aug 08, 1945 Referring Provider (PT): Dr. Shirlean Mylararol Webb   Encounter Date: 11/14/2018  PT End of Session - 11/14/18 0846    Visit Number  7    Date for PT Re-Evaluation  12/14/18    Authorization Type  BCBS federal    PT Start Time  0800    PT Stop Time  0840    PT Time Calculation (min)  40 min    Activity Tolerance  Patient tolerated treatment well;No increased pain    Behavior During Therapy  [facility] for tasks assessed/performed       History reviewed. No pertinent past medical history.  Past Surgical History:  Procedure Laterality Date  . RIGHT OOPHORECTOMY    . TUBAL LIGATION      There were no vitals filed for this visit.  Subjective Assessment - 11/14/18 0803    Subjective  I felt fine after last visit and had no problems. When I up stairs is still not as easy as I feel it should be. I am trying to go step over step. I am not getting the achiness for 45 minutes to 1 hour anymore.    Pertinent History  osteoporosis    Patient Stated Goals  reduce pain, keep doing exercise    Currently in Pain?  No/denies                       [facility]/Ex[facility] - 11/14/18 0001      Lumbar Exercises: Stretches   Active Hamstring Stretch  Left;30 seconds;3 reps   supine with strap   Other Lumbar Stretch Exercise  gastroc on slant board 30 sec 3 times      Lumbar Exercises: Aerobic   Nustep  level 2 program (profile 3) progressive hill, 6 minutes, seat #5, arms #11 while assessing patient      Lumbar Exercises: Machines for Strengthening   Leg Press  85lb bilat seat 6 10x 90# 10x; single 30lb Lt LE 2x10 reps      Knee/Hip Exercises: Standing   Forward Lunges  Left;Right;1 set;10 reps   10 times  bringing the red band across the body   Hip Abduction  Stengthening;Right;Left;2 sets;10 reps;Limitations    Abduction Limitations  green band around thigh    Hip Extension  Stengthening;Right;Left;2 sets;10 reps;Knee straight    Extension Limitations  green band around thigh    Lateral Step Up  Left;1 set;15 reps;Hand Hold: 1    Forward Step Up  Left;1 set;15 reps;Hand Hold: 1   one round side of BOS   Forward Step Up Limitations  on round side of BOSU ball    Step Down  Left;1 set;15 reps;Step Height: 6";Hand Hold: 2    Other Standing Knee Exercises  side setp with red band around hips 20 feet x2    Other Standing Knee Exercises  squat with pulling on red band 10 times with VC to fully straighten the left knee             PT Education - 11/14/18 0846    Education Details  information on osteoporosis    Person(s) Educated  Patient    Methods  Explanation    Comprehension  Verbalized understanding       PT  Short Term Goals - 10/25/18 1537      PT SHORT TERM GOAL #1   Title  independent with initial HEP    Status  Achieved      PT SHORT TERM GOAL #2   Title  pain in left knee after exercise decreased >/= 25% due to improve mobility and strength    Baseline  30% better    Status  Achieved        PT Long Term Goals - 11/14/18 0806      PT LONG TERM GOAL #1   Title  independent with HEP and understand how to progress herself    Time  8    Period  Weeks    Status  On-going      PT LONG TERM GOAL #2   Title  go up and down stairs with step over step with minimal difficulty    Baseline  feels weakness inthe right leg    Time  8    Period  Weeks    Status  On-going      PT LONG TERM GOAL #3   Title  able to exercise at the gym with knee pain afterwards no more than 1-2/10    Time  8    Period  Weeks    Status  On-going      PT LONG TERM GOAL #4   Title  left knee extension 0 degrees wo she is able to walk and exercise with minimal to no pain    Baseline  no  pain today    Time  8    Period  Weeks    Status  On-going      PT LONG TERM GOAL #5   Title  FOTO score </= 18%    Time  8    Period  Weeks    Status  On-going            Plan - 11/14/18 0820    Clinical Impression Statement  Patient is still feeling weakness in the right leg when going up stairs and working hard on doing step over step. Patient is able to do her class workout without increase in pain. When patient wakes up her lft knee feel stiff. Patient is able to do her exercises with improved knee control. Patient will benefit from skilled therapy to improve strength and knee extension for full function.    Personal Factors and Comorbidities  Comorbidity 1;Fitness    Comorbidities  osteopenia    Examination-Activity Limitations  Locomotion Level;Stairs    Stability/Clinical Decision Making  Stable/Uncomplicated    Rehab Potential  Excellent    PT Frequency  2x / week    PT Duration  8 weeks    PT Treatment/Interventions  Cryotherapy;Electrical Stimulation;Iontophoresis 4mg /ml Dexamethasone;Moist Heat;Ultrasound;Therapeutic exercise;Therapeutic activities;Gait training;Stair training;Neuromuscular re-education;Patient/family education;Passive range of motion;Dry needling;Manual techniques;Taping;Joint Manipulations    PT Next Visit Plan  progress quad and hip strength using closed chain, steps, STM as needed    PT Home Exercise Plan  Access Code: [email or web address]    Consulted and Agree with Plan of Care  Patient       Patient will benefit from skilled therapeutic intervention in order to improve the following deficits and impairments:  Abnormal gait, Decreased range of motion, Decreased endurance, Decreased activity tolerance, Pain, Impaired flexibility, Decreased strength  Visit Diagnosis: 1. Acute pain of left knee   2. Stiffness of left knee, not elsewhere classified   3. Muscle weakness (generalized)  4. Other abnormalities of gait and mobility        Problem  List There are no active problems to display for this patient.   Earlie Counts, PT 11/14/18 8:49 AM   [facility] [address] [place], [place], [address] Phone: [phone]   Fax:  [phone]  Name: LEYAN BRANDEN MRN: [id number] Date of Birth: [birth year]

## 2018-11-14 NOTE — Patient Instructions (Addendum)
  Osteoporosis   What is Osteoporosis?  - A silent disease in which the skeleton is weakened by decreased bone density. - Characterized by low bone mass, deterioration of bone, and increased risk of fracture postmenopausal (primary) or the result of an identifiable condition/event (secondary) - Commonly found in the wrists, spine, and hips; these are high-risk stress areas and very susceptible to fractures.  The Facts: - There are 1.5 million fractures/year o 500,000 spine; 250,000 hip with over 60,000 nursing home admissions secondary to hip fracture; and 200,000 wrist - After hip fracture, only 50% of people able to walk independently prior to the fracture return to independent ambulation. - Bone mass: Peaks at age 20-30, and begins declining at age 40-50.   Osteoporosis is defined by the World Health Organization (WHO) as:  NOF/WHO Criteria for Interpreting Results of Bone Density Assessment  Results Diagnosis  Within 1 standard deviation (SD) of young adult mean Normal  Between 1 and -2.5 SD below mean, repeat in 2 years Low bone mass (osteopenia)  Greater than -2.5 SD below mean Osteoporosis  Greater than -2.5 SD below mean and one or more fragility fractures exist Severe Osteoporosis  *Results can be affected by positioning of the body in the DEXA scan, presence of current or old fractures, arthritis, extraneous calcifications.    Osteoporosis is not just a women's disease!  - 30-40% of women will develop osteoporosis - 5-15% of males will develop osteoporosis   What are the risk factors?  1. Female 2. Thin, small frame 3. Caucasian, Asian race 4. Early menopause (<45 years old)/amenorrhea/delayed puberty 5. Old age 6. Family history (fractures, stooped posture)\ 7. Low calcium diet 8. Sedentary lifestyle 9. Alcohol, Caffeine, Smoking 10. Malnutrition, GI Disease 11. Prolonged use of Glucocorticoids (Prednisone), Meds to treat asthma, arthritis, cancers,  thyroid, and anti-seizure meds.  How do I know for sure?  Get a BONE DENSITY TEST!  This measures bone loss and it's painless, non-invasive, and only takes 5-10 minutes!  What can I do about it?  ? Decrease your risk factors (alcohol, caffeine, smoking) ? Helpful medications (see next page) ? Adequate Calcium and Vitamin D intake ? Get active! o Proper posture - Sit and stand tall! No slouching or twisting o Weight-Bearing Exercise - walking, stair climbing, elliptical; NO jogging or high-impact exercise. o Resistive Exercise - Cybex weight equipment, Nautilus, dumbbells, therabands  **Be sure to maintain proper alignment when lifting any weight!!  **When using equipment, avoid abdominal exercises which involve "crunching" or curling or twisting the trunk, biceps machines, cross-country machines, moving handlebars, or ANY MACHINE WITH ROTATION OR FORWARD BENDING!!!           Approved Pharmacologic Management of Osteoporosis  Agent Approved for prevention Approved for treatment BMD increased spine/hip Fracture reduction  Estrogen/Hormone Therapy (Estrace, Estratab, Ogen, Premarin, Vivell, Prempro, Femhert, Orthoest) Yes Yes 3-6% 35% spine and hip  Bisphosphonates  (Fosamax, Actonel, Boniva) Yes Yes 3-8% 35-50% spine and non-spine  Calcitonin (Miacalcin, Calcimar, Fortical) No Yes 0-3% None stated  Raloxifene (Evista) Yes Yes 2-3% 30-55%  Parathyroid Hormone (Forteo) No Yes, only in those at high risk for fracture None stated 53-65%     Recommended Daily Calcium Intakes   Population Group NIH/NOF* (mg elemental calcium)  Children 1-10 years 800-1200  Children 11-24 years 1200-1500  Men and Women 25-64 years At least 1200  Pregnant/Lactating At least 1200  Postmenopausal women with hormone replacement therapy At least 1200  Postmenopausal women without     hormone replacement therapy At least 1200  Men and women 65 + At least 1200  *In 1987, 1990, 1994, and 2000,  the NIH held consensus conferences on osteoporosis and calcium.  This column shows the most recent recommendations regarding calcium intak for preventing and managing osteoporosis.          Calcium Content of Selected Foods  Dairy Foods Calcium Content (mg) Non-Dairy Foods Calcium Content (mg)  Buttermilk, 1 cup 300 Calcium-fortified juice, 1 cup 300  Milk, 1 cup 300 Salmon, canned with Bones, 2 oz 100  Lactaid milk, 1 cup 300-500 Oysters, raw 13-19 medium 226  Soy milk, 1 cup 200-300 Sardines, canned with bones, 3 oz 372  Yogurt (plain, lowfat) 1 cup 250-300 Shrimp, canned 3 oz 98  Frozen yogurt (fruit) 1 cup 200-600 Collard greens, cooked 1 cup 357  Cheddar, mozzarella, or [place] cheese, 1oz 205 Broccoli, cooked 1 cup 78  Cottage cheese (lowfat) 4 oz 200 Soybeans, cooked 1 cup 131  Part-skim ricotta cheese, 4oz 335 Tofu, 4oz* *  Vanilla ice cream, 1 cup 120-300    *Calcium content of tofu varies depending on processing method; check nutritional label on package for precise calcium content.     Suggested Guidelines for Calcium Supplement Use:  ? Calcium is absorbed most efficiently if taken in small amounts throughout the day.  Always divide the daily dose into smaller amounts if the total daily dose is 500mg or more per day.  The body cannot use more than 500mg Calcium at any one time. ? The use of manufactured supplements is encouraged.  Calcium as bone meal or dolomite may contain lead or other heavy metals as contaminants. ? Calcium supplements should not be taken with high fiber meals or with bulk forming laxatives. ? If calcium carbonate is used as the supplement form, it should be taken with meals to assure that stomach acid production is present to facilitate optimal dissolution and absorption of calcium.  This is important if atrophic gastritis with hypo- or achlorhydria is present, which it is in 20-50% of older individuals. ? It is important to drink plenty  of fluids while using the supplement to help reduce problems with side effects like constipation or bloating.  If these symptoms become a problem, switching to another form of supplement may be the answer. ? Another alternative is calcium-fortified foods, including fruit juices, cereals, and breads.  These foods are now marketed with added calcium and may be less likely to cause side effects. ? Those with personal or family histories of kidney stones should be monitored to assure that hypercalcuria does not occur. CALCIUM INTAKE QUIZ  Dairy products are the primary source of calcium for most people.  For a quick estimate of your daily calcium intake, complete the following steps:  1. Use the chart below to determine your daily intake of calcium from diary foods. Servings of dairy per day 1 2 3 4 5 6 7 8  Milligrams (mg) of calcium: 250 500 750 1000 1250 1500 1750 2000   2.  Enter your total daily calcium intake from dairy foods:     _____mg  3.  Add 350 mg, which is the average for all other dietary sources:                 +            350 mg  4.  The sum of your total daily calcium intake:                 ______mg  5.  Enter the recommended calcium intake for your age from the chart below;         ______mg  6.  Enter your daily intake from step 4 above and subtract:                             -        _______mg  7.  The result is how much additional calcium you need:                                          ______mg      Recommended Daily Calcium Intake  Population Calcium (mg)  Children 1-10 years [phone]  Children 11-24 years 01-1499  Men and women 25-64 At least 1200  Pregnant/Lactating At least 1200  Postmenopausal women with hormone replacement therapy At least 1200  Postmenopausal women without hormone replacement therapy At least 1200  Men and women 65+ At least 1200       SAFETY TIPS FOR FALL PREVENTION   1. Remove throw rugs and make certain carpet edges are  securely fastened to the floor.  2. Reduce clutter, especially in traffic areas of the home.   3. Install/maintain sturdy handrails at stairs.  4. Increase wattage of lighting in hallways, bathrooms, kitchens, stairwells, and entrances to home.  5. Use night-lights near bed, in hallways, and in bathroom to improve night safety.  6. Install safety handrails in shower, tub, and around toilet.  Bathtubs and shower stalls should have non-skid surfaces.  7. When you must reach for something high, use a safety step stool, one with wide steps and a friction surface to stand on.  A type equipped with a high handrail is preferred.  8. If a cane or other walking aid has been recommended, use it to help increase your stability.  9. Wear supportive, cushioned, low-heeled shoes.  Avoid "scuffs" (backless bedroom slippers) and high heels.  10. Avoid rushing to answer a phone, doorbell, or anything else!  A portable phone that you can take from room to room with you is a good idea for security and safety.  11. Exercise regularly and stay active!!    [place] www.[email or web address]   Exercise for Osteoporosis; A Safe and Effective Way to Build Bone Density and Muscle Strength By: Burnard Hawthorne, M.A.  Suggestions for Improved Bone Health   - Bones need a DIET rich in vitamins and minerals  - Food rich in minerals: fish, seaweed, fermented soy (ex: miso, tempeh, tamari),  fermented foods (ex: Kimchi), cooked greens, yogurt*   - Vitamins and minerals need to be able to be absorbed and it helps to eat a diet  that is lower in acidity    - DO eat vegetables and foods listed above; drink plenty of water*   - AVOID consuming too much caffiene, meat, carbonated drinks, soda, alcohol,  tobacco products*  - Get enough SLEEP.  Sleep is the only time our bodies repair damaged tissue.  Your body needs 7-8 hours/night and sometimes more  - Reduce stress. Hormones produced by  our bodies during prolonged periods of stress will cause an increased breakdown of bone tissue  -Exercise! Weight bearing and activities that load tendons and bones will help increase bone density  - DO work with your physical  therapist on an exercise program that is right for  you including the following compenents: warm up, strength, flexibility, posture  awareness, balance  - AVOID bending forward combined with twisting, sit ups or crunches, double leg  extensions    *check with your doctor or nutritionist anytime you make dietary changes  [facility] [address] [place],  [address] Phone # [phone] Fax [phone]

## 2018-11-16 ENCOUNTER — Ambulatory Visit: Payer: Federal, State, Local not specified - PPO | Admitting: Physical Therapy

## 2018-11-16 ENCOUNTER — Other Ambulatory Visit: Payer: Self-pay

## 2018-11-16 ENCOUNTER — Encounter: Payer: Self-pay | Admitting: Physical Therapy

## 2018-11-16 DIAGNOSIS — R2689 Other abnormalities of gait and mobility: Secondary | ICD-10-CM

## 2018-11-16 DIAGNOSIS — M25662 Stiffness of left knee, not elsewhere classified: Secondary | ICD-10-CM

## 2018-11-16 DIAGNOSIS — M6281 Muscle weakness (generalized): Secondary | ICD-10-CM

## 2018-11-16 DIAGNOSIS — M25562 Pain in left knee: Secondary | ICD-10-CM | POA: Diagnosis not present

## 2018-11-16 NOTE — Therapy (Signed)
[facility] [address] [place], [place], [address] Phone: [phone]   Fax:  [phone]  Physical Therapy Treatment  Patient Details  Name: Katrina Oliver MRN: [id number] Date of Birth: May 20, 1945 Referring Provider (PT): Dr. Maurice Small   Encounter Date: 11/16/2018  PT End of Session - 11/16/18 0842    Visit Number  8    Date for PT Re-Evaluation  12/14/18    Authorization Type  BCBS federal    PT Start Time  0800    PT Stop Time  0838    PT Time Calculation (min)  38 min    Activity Tolerance  Patient tolerated treatment well;No increased pain    Behavior During Therapy  [facility] for tasks assessed/performed       History reviewed. No pertinent past medical history.  Past Surgical History:  Procedure Laterality Date  . RIGHT OOPHORECTOMY    . TUBAL LIGATION      There were no vitals filed for this visit.  Subjective Assessment - 11/16/18 0806    Subjective  My leg is feeling stronger. Stairs with some weakness but I do not feel like I will fall. Achiness in the left knee is 95% better.    Pertinent History  osteoporosis    Patient Stated Goals  reduce pain, keep doing exercise    Currently in Pain?  No/denies                       [facility] Adult PT Treatment/Exercise - 11/16/18 0001      Lumbar Exercises: Stretches   Active Hamstring Stretch  Left;30 seconds;3 reps   supine with strap   Piriformis Stretch  Right;Left;1 rep;30 seconds   sitting     Lumbar Exercises: Aerobic   Nustep  level 2 program (profile 3) progressive hill, 6 minutes, seat #5, arms #11 while assessing patient      Lumbar Exercises: Machines for Strengthening   Leg Press  90 lb bilat seat 6 10x 3; single 35lb Lt LE 2x10 reps      Knee/Hip Exercises: Standing   Forward Lunges  Left;Right;1 set;10 reps   10 times bringing the green band across the body   Hip Abduction  Stengthening;Right;Left;1 set;10  reps;Knee straight   wrapped around thigh   Extension Limitations  green band around thigh    Forward Step Up  Left;1 set;15 reps;Hand Hold: 1    Forward Step Up Limitations  on round side of BOSU ball    Step Down  Left;1 set;15 reps   on round side of BOSU ball   SLS  stand on left leg and reach for 3 cones 3 reps    Other Standing Knee Exercises  side setp with red band around hips 20 feet x2; squat holding yellow plyo ball 15x    Other Standing Knee Exercises  squat with pulling on green band 15 times with VC to fully straighten the left knee               PT Short Term Goals - 10/25/18 1537      PT SHORT TERM GOAL #1   Title  independent with initial HEP    Status  Achieved      PT SHORT TERM GOAL #2   Title  pain in left knee after exercise decreased >/= 25% due to improve mobility and strength    Baseline  30% better    Status  Achieved        PT Long Term Goals - 11/14/18 0806      PT LONG TERM GOAL #1   Title  independent with HEP and understand how to progress herself    Time  8    Period  Weeks    Status  On-going      PT LONG TERM GOAL #2   Title  go up and down stairs with step over step with minimal difficulty    Baseline  feels weakness inthe right leg    Time  8    Period  Weeks    Status  On-going      PT LONG TERM GOAL #3   Title  able to exercise at the gym with knee pain afterwards no more than 1-2/10    Time  8    Period  Weeks    Status  On-going      PT LONG TERM GOAL #4   Title  left knee extension 0 degrees wo she is able to walk and exercise with minimal to no pain    Baseline  no pain today    Time  8    Period  Weeks    Status  On-going      PT LONG TERM GOAL #5   Title  FOTO score </= 18%    Time  8    Period  Weeks    Status  On-going            Plan - 11/16/18 [id number]    Clinical Impression Statement  Patients achiness has improved by 95%. Patient is getting stronger due to increasing weight on the leg press and  increased resistance for exercise. Patient has decreased control of left knee when standing on left and picking up a cone from the floor. Patient will benefit from skilled therapy to imporve strength and knee extension for full function.    Personal Factors and Comorbidities  Comorbidity 1;Fitness    Comorbidities  osteopenia    Examination-Activity Limitations  Locomotion Level;Stairs    Stability/Clinical Decision Making  Stable/Uncomplicated    Rehab Potential  Excellent    PT Frequency  2x / week    PT Duration  8 weeks    PT Treatment/Interventions  Cryotherapy;Electrical Stimulation;Iontophoresis 4mg /ml Dexamethasone;Moist Heat;Ultrasound;Therapeutic exercise;Therapeutic activities;Gait training;Stair training;Neuromuscular re-education;Patient/family education;Passive range of motion;Dry needling;Manual techniques;Taping;Joint Manipulations    PT Next Visit Plan  progress quad and hip strength using closed chain, steps, STM as needed    PT Home Exercise Plan  Access Code: [email or web address]    Consulted and Agree with Plan of Care  Patient       Patient will benefit from skilled therapeutic intervention in order to improve the following deficits and impairments:  Abnormal gait, Decreased range of motion, Decreased endurance, Decreased activity tolerance, Pain, Impaired flexibility, Decreased strength  Visit Diagnosis: 1. Acute pain of left knee   2. Stiffness of left knee, not elsewhere classified   3. Muscle weakness (generalized)   4. Other abnormalities of gait and mobility        Problem List There are no active problems to display for this patient.   Eulis FosterCheryl Robt Okuda, PT 11/16/18 8:46 AM   [facility] [address] [place], [place], [address] Phone: [phone]   Fax:  [phone]  Name: Katrina Oliver MRN: [id number] Date of Birth: [birth year]

## 2018-11-21 ENCOUNTER — Other Ambulatory Visit: Payer: Self-pay

## 2018-11-21 ENCOUNTER — Ambulatory Visit: Payer: Federal, State, Local not specified - PPO | Admitting: Physical Therapy

## 2018-11-21 DIAGNOSIS — M25562 Pain in left knee: Secondary | ICD-10-CM | POA: Diagnosis not present

## 2018-11-21 DIAGNOSIS — M25662 Stiffness of left knee, not elsewhere classified: Secondary | ICD-10-CM

## 2018-11-21 DIAGNOSIS — R2689 Other abnormalities of gait and mobility: Secondary | ICD-10-CM

## 2018-11-21 DIAGNOSIS — M6281 Muscle weakness (generalized): Secondary | ICD-10-CM

## 2018-11-21 NOTE — Therapy (Signed)
[facility] [address] [place], [place], [address] Phone: [phone]   Fax:  [phone]  Physical Therapy Treatment  Patient Details  Name: Katrina Oliver MRN: [id number] Date of Birth: [birth year] Referring Provider (PT): Dr. Maurice Small   Encounter Date: 11/21/2018  PT End of Session - 11/21/18 1227    Visit Number  9    Date for PT Re-Evaluation  12/14/18    Authorization Type  BCBS federal    PT Start Time  1228    PT Stop Time  1308    PT Time Calculation (min)  40 min    Activity Tolerance  Patient tolerated treatment well;No increased pain    Behavior During Therapy  [facility] for tasks assessed/performed       No past medical history on file.  Past Surgical History:  Procedure Laterality Date  . RIGHT OOPHORECTOMY    . TUBAL LIGATION      There were no vitals filed for this visit.  Subjective Assessment - 11/21/18 1232    Subjective  Just some twinges every now and then.  I was sitting in the car last Thursday driving to [place] and the knee was more achy the next day.  I stretched a little more and it felt better the next day.    Patient Stated Goals  reduce pain, keep doing exercise    Currently in Pain?  No/denies                       [facility] Adult PT Treatment/Exercise - 11/21/18 0001      Lumbar Exercises: Stretches   Active Hamstring Stretch  Left;30 seconds;3 reps   standing heel on step    Gastroc Stretch  Left;30 seconds;Right;3 reps   using rocker board     Lumbar Exercises: Aerobic   Nustep  L2 x 4 min L3 x 2 min, seat # 5, arm #9   PT present for status update     Lumbar Exercises: Machines for Strengthening   Leg Press  90 lb bilat seat 6 10x 3; single 40 lb Lt LE 2x10 reps; 35lb x 10 reps      Knee/Hip Exercises: Standing   Heel Raises  Both;20 reps   slow lowering off edge of step   Lateral Step Up  Left;1 set;15 reps;Hand Hold: 0   slowly lowering   Lateral  Step Up Limitations  stepping up and over BOSU ball - 2 UE support for balance - 10x each way    Step Down  Left;1 set;15 reps;Hand Hold: 1;Step Height: 4"   slowly lowering   SLS  stand on left leg and reach 3 ways with Right on slider - 10x each way    Walking with Sports Cord  20 lb - 4 ways - 5x each way    Other Standing Knee Exercises  squat with pallof pull green band 10 x each side                PT Short Term Goals - 10/25/18 1537      PT SHORT TERM GOAL #1   Title  independent with initial HEP    Status  Achieved      PT SHORT TERM GOAL #2   Title  pain in left knee after exercise decreased >/= 25% due to improve mobility and strength    Baseline  30% better    Status  Achieved  PT Long Term Goals - 11/21/18 1312      PT LONG TERM GOAL #1   Title  independent with HEP and understand how to progress herself    Status  On-going      PT LONG TERM GOAL #2   Title  go up and down stairs with step over step with minimal difficulty    Status  On-going      PT LONG TERM GOAL #3   Title  able to exercise at the gym with knee pain afterwards no more than 1-2/10    Status  On-going      PT LONG TERM GOAL #4   Title  left knee extension 0 degrees wo she is able to walk and exercise with minimal to no pain    Status  On-going      PT LONG TERM GOAL #5   Title  FOTO score </= 18%    Status  On-going            Plan - 11/21/18 1310    Clinical Impression Statement  Pt was able to manage pain that was caused by long car ride effectively with her stretches at home.  pt continues to need some cues during exercises to prevent hip drop on left side with single leg exercises.  Pt will do well with continued skilled PT for improved quad strength.    PT Treatment/Interventions  Cryotherapy;Electrical Stimulation;Iontophoresis 4mg /ml Dexamethasone;Moist Heat;Ultrasound;Therapeutic exercise;Therapeutic activities;Gait training;Stair training;Neuromuscular  re-education;Patient/family education;Passive range of motion;Dry needling;Manual techniques;Taping;Joint Manipulations    PT Next Visit Plan  progress quad and hip strength using closed chain, steps, STM as needed    PT Home Exercise Plan  Access Code: [email or web address]    Consulted and Agree with Plan of Care  Patient       Patient will benefit from skilled therapeutic intervention in order to improve the following deficits and impairments:  Abnormal gait, Decreased range of motion, Decreased endurance, Decreased activity tolerance, Pain, Impaired flexibility, Decreased strength  Visit Diagnosis: 1. Acute pain of left knee   2. Stiffness of left knee, not elsewhere classified   3. Muscle weakness (generalized)   4. Other abnormalities of gait and mobility        Problem List There are no active problems to display for this patient.   Junious SilkJakki L Damean Poffenberger, PT 11/21/2018, 1:13 PM  [facility] [address] [place], [place], [address] Phone: [phone]   Fax:  [phone]  Name: Mart PiggsJacqueline T Feasel MRN: [id number] Date of Birth: [birth year]

## 2018-11-23 ENCOUNTER — Ambulatory Visit: Payer: Federal, State, Local not specified - PPO | Admitting: Physical Therapy

## 2018-11-24 ENCOUNTER — Ambulatory Visit: Payer: Federal, State, Local not specified - PPO | Admitting: Physical Therapy

## 2018-11-24 ENCOUNTER — Other Ambulatory Visit: Payer: Self-pay

## 2018-11-24 ENCOUNTER — Encounter: Payer: Self-pay | Admitting: Physical Therapy

## 2018-11-24 DIAGNOSIS — R2689 Other abnormalities of gait and mobility: Secondary | ICD-10-CM

## 2018-11-24 DIAGNOSIS — M25562 Pain in left knee: Secondary | ICD-10-CM

## 2018-11-24 DIAGNOSIS — M25662 Stiffness of left knee, not elsewhere classified: Secondary | ICD-10-CM

## 2018-11-24 DIAGNOSIS — M6281 Muscle weakness (generalized): Secondary | ICD-10-CM

## 2018-11-24 NOTE — Therapy (Signed)
[facility] [address], [place], [place], [address] Phone: [phone]   Fax:  [phone]  Physical Therapy Treatment Progress Note Reporting Period 10/19/18 to 11/24/18   See note below for Objective Data and Assessment of Progress/Goals.      Patient Details  Name: Katrina Oliver MRN: [id number] Date of Birth: Mar 23, 1946 Referring Provider (PT): Dr. Maurice Small   Encounter Date: 11/24/2018  PT End of Session - 11/24/18 0931    Visit Number  10    Date for PT Re-Evaluation  12/14/18    Authorization Type  BCBS federal    PT Start Time  [phone]    PT Stop Time  0958    PT Time Calculation (min)  40 min    Activity Tolerance  Patient tolerated treatment well;No increased pain    Behavior During Therapy  [facility] for tasks assessed/performed       History reviewed. No pertinent past medical history.  Past Surgical History:  Procedure Laterality Date  . RIGHT OOPHORECTOMY    . TUBAL LIGATION      There were no vitals filed for this visit.      [facility] PT Assessment - 11/24/18 0001      Assessment   Medical Diagnosis  M76.52 Patellar tendonitis of left knee    Referring Provider (PT)  Dr. Maurice Small      Observation/Other Assessments   Focus on Therapeutic Outcomes (FOTO)   14% limited down from 25%                   [facility] Adult PT Treatment/Exercise - 11/24/18 0001      Lumbar Exercises: Aerobic   Nustep  L4 x 6 min, seat # 5, arm #9   PT present for status update and assess FOTO     Lumbar Exercises: Machines for Strengthening   Leg Press  90 lb bilat seat 6 10x 3; single 40 lb Lt LE 2x10 reps; 35lb x 10 reps      Knee/Hip Exercises: Standing   Heel Raises  Both;20 reps   slow lowering off edge of step   Hip Flexion  Stengthening;Right;Left;10 reps;Knee straight    Hip Flexion Limitations  blue band around thigh    Forward Lunges  Left;Right;1 set;10 reps   10 times bringing  the green band across the body   Terminal Knee Extension  Strengthening;Left;1 set;10 reps;Theraband   5 sec hold   Theraband Level (Terminal Knee Extension)  Level 4 (Blue)    Hip Abduction  Stengthening;Right;Left;1 set;10 reps;Knee straight   wrapped around thigh   Abduction Limitations  blue band around thigh    Hip Extension  Stengthening;Right;Left;10 reps;Knee straight    Extension Limitations  blue band around thigh    Lateral Step Up  Left;1 set;Hand Hold: 0;20 reps   slowly lowering   Lateral Step Up Limitations  stepping up and over BOSU ball - 2 UE support for balance - 10x each way    Forward Step Up  Left;1 set;15 reps;Hand Hold: 1    Forward Step Up Limitations  on round side of BOSU ball    SLS  stand on BOSU left leg and reach 3 ways - 10 x each way - 1 hand hold    Walking with Sports Cord  20 lb - 4 ways - 5x each way               PT Short Term Goals -  10/25/18 1537      PT SHORT TERM GOAL #1   Title  independent with initial HEP    Status  Achieved      PT SHORT TERM GOAL #2   Title  pain in left knee after exercise decreased >/= 25% due to improve mobility and strength    Baseline  30% better    Status  Achieved        PT Long Term Goals - 11/24/18 0954      PT LONG TERM GOAL #1   Title  independent with HEP and understand how to progress herself    Status  On-going      PT LONG TERM GOAL #2   Title  go up and down stairs with step over step with minimal difficulty    Baseline  feels some weakness but not as much    Status  Partially Met      PT LONG TERM GOAL #3   Title  able to exercise at the gym with knee pain afterwards no more than 1-2/10    Baseline  no pain    Status  Achieved      PT LONG TERM GOAL #4   Title  left knee extension 0 degrees wo she is able to walk and exercise with minimal to no pain    Baseline  no pain today    Status  On-going      PT LONG TERM GOAL #5   Title  FOTO score </= 18%    Baseline  14% 11/24/18     Status  Achieved            Plan - 11/24/18 0956    Clinical Impression Statement  Pt has met several long term goals as noted above today.  She was able to progress to greater difficulty of exercises using the BOSU ball and only min use of hands.  Pt continues to have some weakness that limits her from heavier housework and feels weakness negotiating stairs.  She is expected to continue to make progress and is recommended to continue skilled PT at this time.    PT Treatment/Interventions  Cryotherapy;Electrical Stimulation;Iontophoresis 66m/ml Dexamethasone;Moist Heat;Ultrasound;Therapeutic exercise;Therapeutic activities;Gait training;Stair training;Neuromuscular re-education;Patient/family education;Passive range of motion;Dry needling;Manual techniques;Taping;Joint Manipulations    PT Next Visit Plan  progress quad and hip strength using closed chain, steps, STM as needed    PT Home Exercise Plan  Access Code: [email or web address]   Consulted and Agree with Plan of Care  Patient       Patient will benefit from skilled therapeutic intervention in order to improve the following deficits and impairments:  Abnormal gait, Decreased range of motion, Decreased endurance, Decreased activity tolerance, Pain, Impaired flexibility, Decreased strength  Visit Diagnosis: 1. Acute pain of left knee   2. Stiffness of left knee, not elsewhere classified   3. Muscle weakness (generalized)   4. Other abnormalities of gait and mobility        Problem List There are no active problems to display for this patient.   JCamillo FlamingDesenglau, PT 11/24/2018, 10:04 AM  [facility] [address] [place] [address]Phone: [phone]  Fax:  [phone] Name: JZAYA KESSENICHMRN: [id number] of Birth: [birth year]

## 2018-11-28 ENCOUNTER — Ambulatory Visit: Payer: Federal, State, Local not specified - PPO | Attending: Family Medicine | Admitting: Physical Therapy

## 2018-11-28 ENCOUNTER — Encounter: Payer: Self-pay | Admitting: Physical Therapy

## 2018-11-28 ENCOUNTER — Other Ambulatory Visit: Payer: Self-pay

## 2018-11-28 DIAGNOSIS — M25562 Pain in left knee: Secondary | ICD-10-CM

## 2018-11-28 DIAGNOSIS — R2689 Other abnormalities of gait and mobility: Secondary | ICD-10-CM

## 2018-11-28 DIAGNOSIS — M25662 Stiffness of left knee, not elsewhere classified: Secondary | ICD-10-CM

## 2018-11-28 DIAGNOSIS — M6281 Muscle weakness (generalized): Secondary | ICD-10-CM

## 2018-11-28 NOTE — Therapy (Signed)
[facility] [address], [place], [place], [address] Phone: [phone]   Fax:  [phone]  Physical Therapy Treatment  Patient Details  Name: Katrina Oliver MRN: [id number] Date of Birth: 1945-12-17 Referring Provider (PT): Dr. Maurice Small   Encounter Date: 11/28/2018  PT End of Session - 11/28/18 0830    Visit Number  11    Date for PT Re-Evaluation  12/14/18    Authorization Type  BCBS federal    PT Start Time  0800    PT Stop Time  0838    PT Time Calculation (min)  38 min    Activity Tolerance  Patient tolerated treatment well;No increased pain    Behavior During Therapy  [facility] for tasks assessed/performed       History reviewed. No pertinent past medical history.  Past Surgical History:  Procedure Laterality Date  . RIGHT OOPHORECTOMY    . TUBAL LIGATION      There were no vitals filed for this visit.  Subjective Assessment - 11/28/18 0807    Subjective  I feel no achiness just a twinge. When going up and down I feel a twinge or weakness.    Pertinent History  osteoporosis    Patient Stated Goals  reduce pain, keep doing exercise    Currently in Pain?  No/denies         [facility] PT Assessment - 11/28/18 0001      Assessment   Medical Diagnosis  M76.52 Patellar tendonitis of left knee    Referring Provider (PT)  Dr. Maurice Small    Onset Date/Surgical Date  07/19/18      Precautions   Precautions  None      Restrictions   Weight Bearing Restrictions  No      Home Environment   Living Environment  Private residence      Cognition   Overall Cognitive Status  Within Functional Limits for tasks assessed      Observation/Other Assessments   Focus on Therapeutic Outcomes (FOTO)   14% limited down from 25%      PROM   Left Knee Extension  0      Strength   Left Hip Flexion  4+/5    Left Hip Extension  4+/5    Left Hip External Rotation  4+/5    Left Hip Internal Rotation  4+/5    Left  Knee Flexion  5/5    Left Knee Extension  5/5                   [facility] Adult PT Treatment/Exercise - 11/28/18 0001      Lumbar Exercises: Stretches   Active Hamstring Stretch  Left;30 seconds;3 reps   standing heel on step    Hip Flexor Stretch  Left;1 rep;30 seconds    Piriformis Stretch  Right;Left;1 rep;30 seconds      Lumbar Exercises: Aerobic   Nustep  L4 x 6 min, seat # 5, arm #9   PT present for status update and assess FOTO     Lumbar Exercises: Machines for Strengthening   Leg Press  90 lb bilat seat 6 10x 3; single 40 lb Lt LE 2x10 reps; 35lb x 10 reps      Knee/Hip Exercises: Standing   Forward Lunges  Left;Right;1 set;10 reps   10 times bringing the green band across the body   SLS  stand on BOSU left leg and reach 3 ways - 10 x  each way - 1 hand hold    SLS with Vectors  stand on left leg and pick up 3 cones 3 times    Other Standing Knee Exercises  monster walk and side stepping with red band around knees      Knee/Hip Exercises: Supine   Quad Sets  Strengthening;Left;1 set;10 reps   hold 5 sec   Straight Leg Raises  Strengthening;Left;1 set;20 reps   with ER   Straight Leg Raises Limitations  VC to keep knee fully extended      Knee/Hip Exercises: Sidelying   Hip ABduction  Strengthening;Left;1 set;15 reps               PT Short Term Goals - 10/25/18 1537      PT SHORT TERM GOAL #1   Title  independent with initial HEP    Status  Achieved      PT SHORT TERM GOAL #2   Title  pain in left knee after exercise decreased >/= 25% due to improve mobility and strength    Baseline  30% better    Status  Achieved        PT Long Term Goals - 11/28/18 2993      PT LONG TERM GOAL #1   Title  independent with HEP and understand how to progress herself    Time  8    Status  Achieved      PT LONG TERM GOAL #2   Title  go up and down stairs with step over step with minimal difficulty    Time  8    Period  Weeks    Status  Achieved       PT LONG TERM GOAL #3   Title  able to exercise at the gym with knee pain afterwards no more than 1-2/10    Baseline  no pain    Time  8    Period  Weeks    Status  Achieved      PT LONG TERM GOAL #4   Title  left knee extension 0 degrees wo she is able to walk and exercise with minimal to no pain    Time  8    Period  Weeks    Status  Achieved      PT LONG TERM GOAL #5   Title  FOTO score </= 18%    Baseline  14% 11/24/18    Time  8    Period  Weeks    Status  Achieved            Plan - 11/28/18 0816    Clinical Impression Statement  Patient has met all of her goals. Patient has full left knee ROM and strength. Patient has increased left hip strength. Patient is able to walk with full left knee extension. Patient has a twinge every so often in the left knee with steps. Patient has no achiness in the left knee. Patient is independent with her HEP. Patient is ready for discharge.    Personal Factors and Comorbidities  Comorbidity 1;Fitness    Comorbidities  osteopenia    Examination-Activity Limitations  Locomotion Level;Stairs    Stability/Clinical Decision Making  Stable/Uncomplicated    Rehab Potential  Excellent    PT Treatment/Interventions  Cryotherapy;Electrical Stimulation;Iontophoresis 48m/ml Dexamethasone;Moist Heat;Ultrasound;Therapeutic exercise;Therapeutic activities;Gait training;Stair training;Neuromuscular re-education;Patient/family education;Passive range of motion;Dry needling;Manual techniques;Taping;Joint Manipulations    PT Next Visit Plan  discharge to HEP    PT Home Exercise Plan  Access Code:  [email or web address]    Consulted and Agree with Plan of Care  Patient       Patient will benefit from skilled therapeutic intervention in order to improve the following deficits and impairments:  Abnormal gait, Decreased range of motion, Decreased endurance, Decreased activity tolerance, Pain, Impaired flexibility, Decreased strength  Visit Diagnosis: 1. Acute pain of left  knee   2. Stiffness of left knee, not elsewhere classified   3. Muscle weakness (generalized)   4. Other abnormalities of gait and mobility        Problem List There are no active problems to display for this patient.   Earlie Counts, PT 11/28/18 8:41 AM   [facility] [address] [place], [place], [address] Phone: [phone]   Fax:  [phone]  Name: SHIRLINE KENDLE MRN: [id number] Date of Birth: Sep 21, 1945  PHYSICAL THERAPY DISCHARGE SUMMARY  Visits from Start of Care: 11  Current functional level related to goals / functional outcomes: See above   Remaining deficits: See above   Education / Equipment: Hep Plan: Patient agrees to discharge.  Patient goals were met. Patient is being discharged due to meeting the stated rehab goals. Thank you for the referral. Earlie Counts, PT 11/28/18 8:42 AM   ?????

## 2018-11-30 ENCOUNTER — Encounter: Payer: Federal, State, Local not specified - PPO | Admitting: Physical Therapy

## 2018-12-05 ENCOUNTER — Encounter: Payer: Federal, State, Local not specified - PPO | Admitting: Physical Therapy

## 2018-12-07 ENCOUNTER — Encounter: Payer: Federal, State, Local not specified - PPO | Admitting: Physical Therapy

## 2018-12-12 ENCOUNTER — Encounter: Payer: Federal, State, Local not specified - PPO | Admitting: Physical Therapy

## 2018-12-14 ENCOUNTER — Encounter: Payer: Federal, State, Local not specified - PPO | Admitting: Physical Therapy

## 2019-04-06 ENCOUNTER — Ambulatory Visit (HOSPITAL_COMMUNITY): Payer: Federal, State, Local not specified - PPO | Admitting: Nurse Practitioner

## 2019-04-10 ENCOUNTER — Ambulatory Visit (HOSPITAL_COMMUNITY)
Admission: RE | Admit: 2019-04-10 | Discharge: 2019-04-10 | Disposition: A | Discharge status: Home / Self Care | Payer: Federal, State, Local not specified - PPO | Source: Ambulatory Visit | Attending: Nurse Practitioner | Admitting: Nurse Practitioner

## 2019-04-10 ENCOUNTER — Other Ambulatory Visit: Payer: Self-pay

## 2019-04-10 ENCOUNTER — Encounter (HOSPITAL_COMMUNITY): Payer: Self-pay | Admitting: Nurse Practitioner

## 2019-04-10 VITALS — BP 150/76 | HR 68 | Ht 62.75 in | Wt 127.6 lb

## 2019-04-10 DIAGNOSIS — I480 Paroxysmal atrial fibrillation: Secondary | ICD-10-CM

## 2019-04-10 DIAGNOSIS — M546 Pain in thoracic spine: Secondary | ICD-10-CM | POA: Insufficient documentation

## 2019-04-10 DIAGNOSIS — D6869 Other thrombophilia: Secondary | ICD-10-CM | POA: Diagnosis not present

## 2019-04-10 DIAGNOSIS — Z87891 Personal history of nicotine dependence: Secondary | ICD-10-CM | POA: Insufficient documentation

## 2019-04-10 DIAGNOSIS — Z79899 Other long term (current) drug therapy: Secondary | ICD-10-CM | POA: Insufficient documentation

## 2019-04-10 DIAGNOSIS — Z7901 Long term (current) use of anticoagulants: Secondary | ICD-10-CM | POA: Insufficient documentation

## 2019-04-10 NOTE — Progress Notes (Signed)
Patient ID: Katrina Oliver, female   DOB: 06/05/1945, 73 y.o.   MRN: [id number]        Primary Care Physician: Dr. Darcus Austin Referring Physician: Same    Katrina Oliver is a 73 y.o. female with a h/o PAF that she first noticed very brief episodes that lasted 1-2 hours. She presented to the PCP office in March with an epiosode of irregular heart beat but by the time she got there, EKG showed SR. She presented to her PCP office yesterday and was found to have afib with rvr at 138 bpm. She was started on metoprolol and asked to be seen here today.  Now in SR.She does have a CHA2DS2VASc of 2  and would by guidelines be a candidate for anticoagulant. No bleeding issues.  Will stop asa. No previous echo.  Lifestyle issues reviewed and pt  states she is active at the gym, normal weight, no snoring history, no tobacco, does drink two alcoholic drinks a night.She was started on eliquis and echo was ordered.  She returns 09/17/15, no further afib, but is c/o fatigue probably from metoprolol. Echo showed normal structure heart. No bleeding issues with eliquis.  F/u 12/23/16- She reports that she is doing well. Only had one episode of irregular heart beat that resolved within one hour with an extra metoprolol in the last three months. Cutting dose of metoprolol helped with the feeling of fatigue. No issues with  DOAC.   F/u in afib clinic 12/12, she reports in 9 months, 2 episodes of afib both lasting less than 2 hours each, converted with extra dose of metoprolol.No issues with eliquis.  F/u in afib clinic, 12/12.  She has done well over the last year. Has had 2 episodes of afib lasted less than 2 hours. Usually  Takes an extra metoprolol for this. Doing well on her DOAC, no signsof bleeding. WIll obtain labs(CBC/BMET) drawn with PCP this past June for review.  F/u in afib clinic, 04/10/19, for yearly visit. She has had 3 episodes over the last year. Each converted within the hour with extra  BB. She also reports that she has had several episodes of feeling discomfort between her shoulder blades with a feeling of having to burp over the last few months. She had this several years ago and stress test was normal. The d/c will be relieved  with a Tums. Usually is close to after eating with exertional activities. She has just downsized and moved last week, a lot of heavy lifting with boxes.  No issues with bleeding with eliquis 5 mg bid with a CHA2DS2VASc score of 3, (age and turns 23 on the 27th)  Today, she denies symptoms of palpitations, chest pain, shortness of breath, orthopnea, PND, lower extremity edema, dizziness, presyncope, syncope, or neurologic sequela. The patient is tolerating medications without difficulties and is otherwise without complaint today.   No past medical history on file.   Current Outpatient Medications  Medication Sig Dispense Refill  . alendronate (FOSAMAX) 70 MG tablet TAKE 1 TABLET 30 MINUTES BEFORE THE FIRST FOOD/BEV/ OR MEDICINE OF THE DAY WITH WATER ONCE WEEKLY    . calcium carbonate (OSCAL) 1500 (600 Ca) MG TABS tablet Take 600 mg of elemental calcium by mouth daily with breakfast.    . calcium carbonate (TITRALAC) 420 MG CHEW chewable tablet Chew 420 mg by mouth.    . cetirizine (ZYRTEC) 10 MG tablet Take 10 mg by mouth daily.    Marland Kitchen ELIQUIS 5 MG TABS  tablet TAKE 1 TABLET TWICE A DAY 180 tablet 3  . Estradiol (VAGIFEM) 10 MCG TABS vaginal tablet Place 1 tablet vaginally 2 (two) times a week.    . fexofenadine (ALLEGRA) 180 MG tablet Take 180 mg by mouth daily.    . fluticasone (FLONASE) 50 MCG/ACT nasal spray Place 1 spray into both nostrils as needed for allergies or rhinitis.    . metoprolol tartrate (LOPRESSOR) 25 MG tablet TAKE 1/2 TABLET (12.5 MG   TOTAL) TWO TIMES A DAY 90 tablet 3  . Multiple Vitamin (MULTIVITAMIN) tablet Take 1 tablet by mouth daily.    Marland Kitchen olopatadine (PATANOL) 0.1 % ophthalmic solution Place 1 drop into both eyes as needed for  allergies.    Marland Kitchen omega-3 acid ethyl esters (LOVAZA) 1 g capsule Take 1 g by mouth daily.      No current facility-administered medications for this encounter.    Allergies  Allergen Reactions  . Other     alloway eye drops    Social History   Socioeconomic History  . Marital status: Single    Spouse name: Not on file  . Number of children: Not on file  . Years of education: Not on file  . Highest education level: Not on file  Occupational History  . Not on file  Tobacco Use  . Smoking status: Former Games developer  . Smokeless tobacco: Never Used  Substance and Sexual Activity  . Alcohol use: Yes    Alcohol/week: 2.0 standard drinks    Types: 2 Standard drinks or equivalent per week    Comment: daily  . Drug use: Never  . Sexual activity: Not on file  Other Topics Concern  . Not on file  Social History Narrative  . Not on file   Social Determinants of Health   Financial Resource Strain:   . Difficulty of Paying Living Expenses: Not on file  Food Insecurity:   . Worried About Programme researcher, broadcasting/film/video in the Last Year: Not on file  . Ran Out of Food in the Last Year: Not on file  Transportation Needs:   . Lack of Transportation (Medical): Not on file  . Lack of Transportation (Non-Medical): Not on file  Physical Activity:   . Days of Exercise per Week: Not on file  . Minutes of Exercise per Session: Not on file  Stress:   . Feeling of Stress : Not on file  Social Connections:   . Frequency of Communication with Friends and Family: Not on file  . Frequency of Social Gatherings with Friends and Family: Not on file  . Attends Religious Services: Not on file  . Active Member of Clubs or Organizations: Not on file  . Attends Banker Meetings: Not on file  . Marital Status: Not on file  Intimate Partner Violence:   . Fear of Current or Ex-Partner: Not on file  . Emotionally Abused: Not on file  . Physically Abused: Not on file  . Sexually Abused: Not on file     No family history on file.  ROS- All systems are reviewed and negative except as per the HPI above  Physical Exam: Vitals:   04/10/19 1023  BP: (!) 150/76  Pulse: 68  Weight: 57.9 kg  Height: 5' 2.75" (1.594 m)    GEN- The patient is well appearing, alert and oriented x 3 today.   Head- normocephalic, atraumatic Eyes-  Sclera clear, conjunctiva pink Ears- hearing intact Oropharynx- clear Neck- supple, no JVP Lymph- no  cervical lymphadenopathy Lungs- Clear to ausculation bilaterally, normal work of breathing Heart- Regular rate and rhythm, no murmurs, rubs or gallops, PMI not laterally displaced GI- soft, NT, ND, + BS Extremities- no clubbing, cyanosis, or edema MS- no significant deformity or atrophy Skin- no rash or lesion Psych- euthymic mood, full affect Neuro- strength and sensation are intact  EKG- Normal ekg with NSR at 68 bpm, pr int 122 ms, qrs int 74 ms, qtc 408 ms  - Left ventricle: The cavity size was normal. Systolic function was   normal. The estimated ejection fraction was in the range of 55%   to 60%. Wall motion was normal; there were no regional wall   motion abnormalities. Left ventricular diastolic function   parameters were normal. - Mitral valve: There was mild regurgitation.  Assessment and Plan: 1. PAF with very low AF burden Continue metoprolol 25 mg 1/2 bid  Can take an extra 1/2 tab of metoprolol for breakthrough episodes  2. Chadsvasc score of 3 No bleeding history Continue eliquis 5 mg bid  No bleeding issues  3.  Back discomfort Relieved with tums I explained to pt that symptoms are still concerning for angina She feels the move has worsened her symptoms, but she still feels it is more  an indigestion issue. If occurs more frequently, she  Reassured me,  she will call back to office and I  will order stress test    F/u one year in afib clinic    Lupita LeashDonna C. Matthew Folksarroll, ANP-C Afib Clinic [facility] [address] [place], [place] [address] [phone]

## 2019-05-06 ENCOUNTER — Ambulatory Visit: Payer: Federal, State, Local not specified - PPO | Attending: Internal Medicine

## 2019-05-06 ENCOUNTER — Ambulatory Visit: Payer: Federal, State, Local not specified - PPO

## 2019-05-06 DIAGNOSIS — Z23 Encounter for immunization: Secondary | ICD-10-CM

## 2019-05-06 NOTE — Progress Notes (Signed)
   Covid-19 Vaccination Clinic  Name:  Katrina Oliver    MRN: [id number] DOB: 24-Mar-1946  05/06/2019  [name] was observed post Covid-19 immunization for 30 minutes based on pre-vaccination screening without incidence. She was provided with Vaccine Information Sheet and instruction to access the V-Safe system.   [name] was instructed to call 911 with any severe reactions post vaccine: Marland Kitchen Difficulty breathing  . Swelling of your face and throat  . A fast heartbeat  . A bad rash all over your body  . Dizziness and weakness    Immunizations Administered    Name Date Dose VIS Date Route   Pfizer COVID-19 Vaccine 05/06/2019 10:33 AM 0.3 mL 04/06/2019 Intramuscular   Manufacturer: [employer], [employer]   Lot: [id number]   NDC: [address]-8

## 2019-05-25 ENCOUNTER — Ambulatory Visit: Payer: Federal, State, Local not specified - PPO

## 2019-05-26 ENCOUNTER — Ambulatory Visit: Payer: Federal, State, Local not specified - PPO | Attending: Internal Medicine

## 2019-05-26 DIAGNOSIS — Z23 Encounter for immunization: Secondary | ICD-10-CM | POA: Insufficient documentation

## 2019-05-26 NOTE — Progress Notes (Signed)
   Covid-19 Vaccination Clinic  Name:  Katrina Oliver    MRN: [id number] DOB: 10/13/1945  05/26/2019  [name] was observed post Covid-19 immunization for 15 minutes without incidence. She was provided with Vaccine Information Sheet and instruction to access the V-Safe system.   [name] was instructed to call 911 with any severe reactions post vaccine: Marland Kitchen Difficulty breathing  . Swelling of your face and throat  . A fast heartbeat  . A bad rash all over your body  . Dizziness and weakness    Immunizations Administered    Name Date Dose VIS Date Route   Pfizer COVID-19 Vaccine 05/26/2019  8:18 AM 0.3 mL 04/06/2019 Intramuscular   Manufacturer: [employer], [employer]   Lot: [id number]   NDC: [address]-3

## 2019-08-31 ENCOUNTER — Other Ambulatory Visit (HOSPITAL_COMMUNITY): Payer: Self-pay | Admitting: Nurse Practitioner

## 2019-11-02 ENCOUNTER — Other Ambulatory Visit: Payer: Self-pay | Admitting: *Deleted

## 2019-11-02 DIAGNOSIS — Z87891 Personal history of nicotine dependence: Secondary | ICD-10-CM

## 2019-12-05 ENCOUNTER — Ambulatory Visit
Admission: RE | Admit: 2019-12-05 | Discharge: 2019-12-05 | Disposition: A | Discharge status: Home / Self Care | Payer: Federal, State, Local not specified - PPO | Source: Ambulatory Visit | Attending: Acute Care | Admitting: Acute Care

## 2019-12-05 ENCOUNTER — Encounter: Payer: Self-pay | Admitting: Acute Care

## 2019-12-05 ENCOUNTER — Other Ambulatory Visit: Payer: Self-pay

## 2019-12-05 ENCOUNTER — Ambulatory Visit (INDEPENDENT_AMBULATORY_CARE_PROVIDER_SITE_OTHER): Payer: Federal, State, Local not specified - PPO | Admitting: Acute Care

## 2019-12-05 VITALS — BP 124/84 | HR 67 | Temp 97.2°F | Ht 63.0 in | Wt 130.4 lb

## 2019-12-05 DIAGNOSIS — Z122 Encounter for screening for malignant neoplasm of respiratory organs: Secondary | ICD-10-CM | POA: Insufficient documentation

## 2019-12-05 DIAGNOSIS — Z87891 Personal history of nicotine dependence: Secondary | ICD-10-CM | POA: Diagnosis not present

## 2019-12-05 NOTE — Progress Notes (Addendum)
Shared Decision Making Visit Lung Cancer Screening Program [phone])   Eligibility:  Age 74 y.o.  Pack Years Smoking History Calculation 35 pack year smoking history (# packs/per year x # years smoked)  Recent History of coughing up blood  no  Unexplained weight loss? no ( >Than 15 pounds within the last 6 months )  Prior History Lung / other cancer no (Diagnosis within the last 5 years already requiring surveillance chest CT Scans).  Smoking Status Former Smoker  Former Smokers: Years since quit: 14 years ago  Quit Date: 2006  Visit Components:  Discussion included one or more decision making aids. yes  Discussion included risk/benefits of screening. yes  Discussion included potential follow up diagnostic testing for abnormal scans. yes  Discussion included meaning and risk of over diagnosis. yes  Discussion included meaning and risk of False Positives. yes  Discussion included meaning of total radiation exposure. yes  Counseling Included:  Importance of adherence to annual lung cancer LDCT screening. yes  Impact of comorbidities on ability to participate in the program. yes  Ability and willingness to under diagnostic treatment. yes  Smoking Cessation Counseling:  Current Smokers:   Discussed importance of smoking cessation. yes  Information about tobacco cessation classes and interventions provided to patient. yes  Patient provided with "ticket" for LDCT Scan. yes  Symptomatic Patient. no  Counseling  Diagnosis Code: Tobacco Use Z72.0  Asymptomatic Patient yes  Counseling (Intermediate counseling: > three minutes counseling) X4128  Former Smokers:   Discussed the importance of maintaining cigarette abstinence. yes  Diagnosis Code: Personal History of Nicotine Dependence. N86.767  Information about tobacco cessation classes and interventions provided to patient. Yes  Patient provided with "ticket" for LDCT Scan. yes  Written Order for Lung  Cancer Screening with LDCT placed in Epic. Yes (CT Chest Lung Cancer Screening Low Dose W/O CM) [id number] Z12.2-Screening of respiratory organs Z87.891-Personal history of nicotine dependence   BP 124/84 (BP Location: Left Arm, Cuff Size: Normal)    Pulse 67    Temp (!) 97.2 F (36.2 C) (Oral)    Ht 5\' 3"  (1.6 m)    Wt 130 lb 6.4 oz (59.1 kg)    SpO2 97%    BMI 23.10 kg/m    I spent 25 minutes of face to face time with Ms. Stoltzfus discussing the risks and benefits of lung cancer screening. We viewed a power point together that explained in detail the above noted topics. We took the time to pause the power point at intervals to allow for questions to be asked and answered to ensure understanding. We discussed that she had taken the single most powerful action possible to decrease her risk of developing lung cancer when she quit smoking. I counseled her to remain smoke free, and to contact me if she ever had the desire to smoke again so that I can provide resources and tools to help support the effort to remain smoke free. We discussed the time and location of the scan, and that either  Daiva Huge RN or I will call with the results within  24-48 hours of receiving them. She has my card and contact information in the event she needs to speak with me, in addition to a copy of the power point we reviewed as a resource. She verbalized understanding of all of the above and had no further questions upon leaving the office.     I explained to the patient that there has been a high incidence of  coronary artery disease noted on these exams. I explained that this is a non-gated exam therefore degree or severity cannot be determined. This patient is not on statin therapy. I have asked the patient to follow-up with their PCP regarding any incidental finding of coronary artery disease and management with diet or medication as they feel is clinically indicated. The patient verbalized understanding of the above and  had no further questions.     Bevelyn Ngo, NP 12/05/2019

## 2019-12-05 NOTE — Patient Instructions (Signed)
Thank you for participating in the [facility]. It was our pleasure to meet you today. We will call you with the results of your scan within the next few days. Your scan will be assigned a Lung RADS category score by the physicians reading the scans.  This Lung RADS score determines follow up scanning.  See below for description of categories, and follow up screening recommendations. We will be in touch to schedule your follow up screening annually or based on recommendations of our providers. We will fax a copy of your scan results to your Primary Care Physician, or the physician who referred you to the program, to ensure they have the results. Please call the office if you have any questions or concerns regarding your scanning experience or results.  Our office number is [phone]. Please speak with Denise Phelps, RN. She is our Lung Cancer Screening RN. If she is unavailable when you call, please have the office staff send her a message. She will return your call at her earliest convenience. Remember, if your scan is normal, we will scan you annually as long as you continue to meet the criteria for the program. (Age 55-77, Current smoker or smoker who has quit within the last 15 years). If you are a smoker, remember, quitting is the single most powerful action that you can take to decrease your risk of lung cancer and other pulmonary, breathing related problems. We know quitting is hard, and we are here to help.  Please let us know if there is anything we can do to help you meet your goal of quitting. If you are a former smoker, congratulations. We are proud of you! Remain smoke free! Remember you can refer friends or family members through the number above.  We will screen them to make sure they meet criteria for the program. Thank you for helping us take better care of you by participating in Lung Screening.  Lung RADS Categories:  Lung RADS 1: no nodules  or definitely non-concerning nodules.  Recommendation is for a repeat annual scan in 12 months.  Lung RADS 2:  nodules that are non-concerning in appearance and behavior with a very low likelihood of becoming an active cancer. Recommendation is for a repeat annual scan in 12 months.  Lung RADS 3: nodules that are probably non-concerning , includes nodules with a low likelihood of becoming an active cancer.  Recommendation is for a 6-month repeat screening scan. Often noted after an upper respiratory illness. We will be in touch to make sure you have no questions, and to schedule your 6-month scan.  Lung RADS 4 A: nodules with concerning findings, recommendation is most often for a follow up scan in 3 months or additional testing based on our provider's assessment of the scan. We will be in touch to make sure you have no questions and to schedule the recommended 3 month follow up scan.  Lung RADS 4 B:  indicates findings that are concerning. We will be in touch with you to schedule additional diagnostic testing based on our provider's  assessment of the scan.   

## 2019-12-06 ENCOUNTER — Other Ambulatory Visit: Payer: Self-pay | Admitting: *Deleted

## 2019-12-06 DIAGNOSIS — Z87891 Personal history of nicotine dependence: Secondary | ICD-10-CM

## 2019-12-06 NOTE — Progress Notes (Signed)
Please call patient and let them  know their  low dose Ct was read as a Lung RADS 1, negative study: no nodules or definitely benign nodules. Radiology recommendation is for a repeat LDCT in 12 months. .Please let them  know we will order and schedule their  annual screening scan for 11/2020. Please let them  know there was notation of CAD on their  scan.  Please remind the patient  that this is a non-gated exam therefore degree or severity of disease  cannot be determined. Please have them  follow up with their PCP regarding potential risk factor modification, dietary therapy or pharmacologic therapy if clinically indicated. Pt.  is  not currently on statin therapy. Please place order for annual  screening scan for  11/2020 and fax results to PCP. Thanks so much.

## 2020-04-08 ENCOUNTER — Ambulatory Visit (HOSPITAL_COMMUNITY)
Admission: RE | Admit: 2020-04-08 | Discharge: 2020-04-08 | Disposition: A | Discharge status: Home / Self Care | Payer: Federal, State, Local not specified - PPO | Source: Ambulatory Visit | Attending: Nurse Practitioner | Admitting: Nurse Practitioner

## 2020-04-08 ENCOUNTER — Other Ambulatory Visit: Payer: Self-pay

## 2020-04-08 VITALS — BP 176/86 | HR 66 | Ht 63.0 in | Wt 130.6 lb

## 2020-04-08 DIAGNOSIS — D6869 Other thrombophilia: Secondary | ICD-10-CM | POA: Diagnosis not present

## 2020-04-08 DIAGNOSIS — Z7901 Long term (current) use of anticoagulants: Secondary | ICD-10-CM | POA: Diagnosis not present

## 2020-04-08 DIAGNOSIS — Z87891 Personal history of nicotine dependence: Secondary | ICD-10-CM | POA: Insufficient documentation

## 2020-04-08 DIAGNOSIS — I480 Paroxysmal atrial fibrillation: Secondary | ICD-10-CM | POA: Diagnosis present

## 2020-04-08 DIAGNOSIS — Z79899 Other long term (current) drug therapy: Secondary | ICD-10-CM | POA: Diagnosis not present

## 2020-04-08 NOTE — Progress Notes (Signed)
Patient ID: CARL BLEECKER, female   DOB: 01/28/1946, 74 y.o.   MRN: [id number]        Primary Care Physician: Dr. Shaune Pollack Referring Physician: Same    Katrina Oliver is a 74 y.o. female with a h/o PAF that she first noticed very brief episodes that lasted 1-2 hours. She presented to the PCP office in March with an epiosode of irregular heart beat but by the time she got there, EKG showed SR. She presented to her PCP office yesterday and was found to have afib with rvr at 138 bpm. She was started on metoprolol and asked to be seen here today.  Now in SR.She does have a CHA2DS2VASc of 2  and would by guidelines be a candidate for anticoagulant. No bleeding issues.  Will stop asa. No previous echo.  Lifestyle issues reviewed and pt  states she is active at the gym, normal weight, no snoring history, no tobacco, does drink two alcoholic drinks a night.She was started on eliquis and echo was ordered.  She returns 09/17/15, no further afib, but is c/o fatigue probably from metoprolol. Echo showed normal structure heart. No bleeding issues with eliquis.  F/u 12/23/16- She reports that she is doing well. Only had one episode of irregular heart beat that resolved within one hour with an extra metoprolol in the last three months. Cutting dose of metoprolol helped with the feeling of fatigue. No issues with  DOAC.   F/u in afib clinic 12/12, she reports in 9 months, 2 episodes of afib both lasting less than 2 hours each, converted with extra dose of metoprolol.No issues with eliquis.  F/u in afib clinic, 12/12.  She has done well over the last year. Has had 2 episodes of afib lasted less than 2 hours. Usually  Takes an extra metoprolol for this. Doing well on her DOAC, no signsof bleeding. WIll obtain labs(CBC/BMET) drawn with PCP this past June for review.  F/u in afib clinic, 04/10/19, for yearly visit. She has had 3 episodes over the last year. Each converted within the hour with extra  BB. She also reports that she has had several episodes of feeling discomfort between her shoulder blades with a feeling of having to burp over the last few months. She had this several years ago and stress test was normal. The d/c will be relieved  with a Tums. Usually is close to after eating with exertional activities. She has just downsized and moved last week, a lot of heavy lifting with boxes.  No issues with bleeding with eliquis 5 mg bid with a CHA2DS2VASc score of 3, (age and turns 5 on the 27th)  F/u 04/08/20. She has had 4 episodes of afib over the last year. Last  around 4 hours at a time. Converts with an extra dose of BB. The chest discomfort she described last year during visit improved. The only time she notices"indigestion" is when she ate late and then did an AM exercise class. The chest burning is relived with one tum. Otherwise no complaints and continues on DOAC without issues with a CHA2DS2VASc score of 3. BP is elevated today but runs 120-130 systolic at home.   Today, she denies symptoms of palpitations, chest pain, shortness of breath, orthopnea, PND, lower extremity edema, dizziness, presyncope, syncope, or neurologic sequela. The patient is tolerating medications without difficulties and is otherwise without complaint today.   No past medical history on file.   Current Outpatient Medications  Medication Sig Dispense  Refill  . alendronate (FOSAMAX) 70 MG tablet TAKE 1 TABLET 30 MINUTES BEFORE THE FIRST FOOD/BEV/ OR MEDICINE OF THE DAY WITH WATER ONCE WEEKLY    . calcium carbonate (OSCAL) 1500 (600 Ca) MG TABS tablet Take 600 mg of elemental calcium by mouth daily with breakfast.    . calcium carbonate (TITRALAC) 420 MG CHEW chewable tablet Chew 420 mg by mouth as needed.    . cetirizine (ZYRTEC) 10 MG tablet Take 10 mg by mouth daily.    Marland Kitchen ELIQUIS 5 MG TABS tablet TAKE 1 TABLET TWICE A DAY 180 tablet 3  . Estradiol 10 MCG TABS vaginal tablet Place 1 tablet vaginally 2 (two)  times a week.    . fluticasone (FLONASE) 50 MCG/ACT nasal spray Place 1 spray into both nostrils as needed for allergies or rhinitis.    . Magnesium 250 MG TABS Take 1 tablet by mouth daily.    . metoprolol tartrate (LOPRESSOR) 25 MG tablet TAKE 1/2 TABLET (12.5 MG   TOTAL) TWO TIMES A DAY 90 tablet 3  . Multiple Vitamin (MULTIVITAMIN) tablet Take 1 tablet by mouth daily.    Marland Kitchen omega-3 acid ethyl esters (LOVAZA) 1 g capsule Take 1 g by mouth daily.     Marland Kitchen Propylene Glycol (SYSTANE COMPLETE) 0.6 % SOLN as needed.     No current facility-administered medications for this encounter.    Allergies  Allergen Reactions  . Other     alloway eye drops    Social History   Socioeconomic History  . Marital status: Married    Spouse name: Not on file  . Number of children: Not on file  . Years of education: Not on file  . Highest education level: Not on file  Occupational History  . Not on file  Tobacco Use  . Smoking status: Former Games developer  . Smokeless tobacco: Never Used  Substance and Sexual Activity  . Alcohol use: Yes    Alcohol/week: 2.0 standard drinks    Types: 2 Standard drinks or equivalent per week    Comment: daily  . Drug use: Never  . Sexual activity: Not on file  Other Topics Concern  . Not on file  Social History Narrative  . Not on file   Social Determinants of Health   Financial Resource Strain: Not on file  Food Insecurity: Not on file  Transportation Needs: Not on file  Physical Activity: Not on file  Stress: Not on file  Social Connections: Not on file  Intimate Partner Violence: Not on file    No family history on file.  ROS- All systems are reviewed and negative except as per the HPI above  Physical Exam: Vitals:   04/08/20 1129  BP: (!) 176/86  Pulse: 66  Weight: 59.2 kg  Height: 5\' 3"  (1.6 m)    GEN- The patient is well appearing, alert and oriented x 3 today.   Head- normocephalic, atraumatic Eyes-  Sclera clear, conjunctiva pink Ears-  hearing intact Oropharynx- clear Neck- supple, no JVP Lymph- no cervical lymphadenopathy Lungs- Clear to ausculation bilaterally, normal work of breathing Heart- Regular rate and rhythm, no murmurs, rubs or gallops, PMI not laterally displaced GI- soft, NT, ND, + BS Extremities- no clubbing, cyanosis, or edema MS- no significant deformity or atrophy Skin- no rash or lesion Psych- euthymic mood, full affect Neuro- strength and sensation are intact  EKG- Normal ekg with NSR at 66 bpm, pr int 126 ms, qrs int 68 ms, qtc 423 ms  -  Left ventricle: The cavity size was normal. Systolic function was   normal. The estimated ejection fraction was in the range of 55%   to 60%. Wall motion was normal; there were no regional wall   motion abnormalities. Left ventricular diastolic function   parameters were normal. - Mitral valve: There was mild regurgitation.  Assessment and Plan: 1. PAF with very low AF burden Continue metoprolol 25 mg 1/2 bid  Can take an extra 1/2 tab of metoprolol for breakthrough episodes  2. Chadsvasc score of 3 No bleeding history Continue eliquis 5 mg bid  No bleeding issues   F/u one year in afib clinic, sooner if burden increases    Lupita Leash [facility] [address] [place], [place] [address] [phone]

## 2020-07-21 ENCOUNTER — Other Ambulatory Visit (HOSPITAL_COMMUNITY): Payer: Self-pay | Admitting: Nurse Practitioner

## 2020-11-24 ENCOUNTER — Other Ambulatory Visit: Payer: Self-pay | Admitting: *Deleted

## 2020-12-04 ENCOUNTER — Other Ambulatory Visit: Payer: Self-pay | Admitting: Family Medicine

## 2020-12-04 ENCOUNTER — Other Ambulatory Visit: Payer: Self-pay

## 2020-12-04 ENCOUNTER — Ambulatory Visit
Admission: RE | Admit: 2020-12-04 | Discharge: 2020-12-04 | Disposition: A | Discharge status: Home / Self Care | Payer: Federal, State, Local not specified - PPO | Source: Ambulatory Visit | Attending: Family Medicine | Admitting: Family Medicine

## 2020-12-04 DIAGNOSIS — M25552 Pain in left hip: Secondary | ICD-10-CM

## 2020-12-04 DIAGNOSIS — M25551 Pain in right hip: Secondary | ICD-10-CM

## 2021-01-05 ENCOUNTER — Telehealth: Payer: Self-pay | Admitting: Acute Care

## 2021-01-05 NOTE — Telephone Encounter (Signed)
Spoke with pt and advised that she no longer qualifies for lung cancer screening due to quitting smoking over 15 years ago. Pt verbalized understanding and will f/u with Dr Hyman Hopes.

## 2021-04-09 ENCOUNTER — Other Ambulatory Visit: Payer: Self-pay

## 2021-04-09 ENCOUNTER — Encounter (HOSPITAL_COMMUNITY): Payer: Self-pay | Admitting: Nurse Practitioner

## 2021-04-09 ENCOUNTER — Ambulatory Visit (HOSPITAL_COMMUNITY)
Admission: RE | Admit: 2021-04-09 | Discharge: 2021-04-09 | Disposition: A | Discharge status: Home / Self Care | Payer: Federal, State, Local not specified - PPO | Source: Ambulatory Visit | Attending: Nurse Practitioner | Admitting: Nurse Practitioner

## 2021-04-09 VITALS — BP 186/90 | HR 66 | Ht 63.0 in | Wt 130.8 lb

## 2021-04-09 DIAGNOSIS — Z7901 Long term (current) use of anticoagulants: Secondary | ICD-10-CM | POA: Insufficient documentation

## 2021-04-09 DIAGNOSIS — Z09 Encounter for follow-up examination after completed treatment for conditions other than malignant neoplasm: Secondary | ICD-10-CM | POA: Diagnosis not present

## 2021-04-09 DIAGNOSIS — I480 Paroxysmal atrial fibrillation: Secondary | ICD-10-CM | POA: Diagnosis not present

## 2021-04-09 DIAGNOSIS — I10 Essential (primary) hypertension: Secondary | ICD-10-CM | POA: Insufficient documentation

## 2021-04-09 DIAGNOSIS — Z87891 Personal history of nicotine dependence: Secondary | ICD-10-CM | POA: Diagnosis not present

## 2021-04-09 DIAGNOSIS — D6869 Other thrombophilia: Secondary | ICD-10-CM | POA: Diagnosis not present

## 2021-04-09 LAB — CBC
HCT: 43.5 % (ref 36.0–46.0)
Hemoglobin: 14.4 g/dL (ref 12.0–15.0)
MCH: 32.4 pg (ref 26.0–34.0)
MCHC: 33.1 g/dL (ref 30.0–36.0)
MCV: 97.8 fL (ref 80.0–100.0)
Platelets: 240 10*3/uL (ref 150–400)
RBC: 4.45 MIL/uL (ref 3.87–5.11)
RDW: 12.3 % (ref 11.5–15.5)
WBC: 4.7 10*3/uL (ref 4.0–10.5)
nRBC: 0 % (ref 0.0–0.2)

## 2021-04-09 MED ORDER — METOPROLOL TARTRATE 25 MG PO TABS: 25.0000 mg | ORAL_TABLET | Freq: Two times a day (BID) | ORAL | 3 refills | Status: DC

## 2021-04-09 MED ORDER — METOPROLOL TARTRATE 25 MG PO TABS: 25.0000 mg | ORAL_TABLET | Freq: Two times a day (BID) | ORAL | Status: DC

## 2021-04-09 NOTE — Progress Notes (Signed)
Patient ID: KDYNCE KOROMA, female   DOB: 1945-11-25, 75 y.o.   MRN: [id number]        Primary Care Physician: Dr. Darcus Austin Referring Physician: Same    Katrina Oliver is a 75 y.o. female with a h/o PAF that she first noticed very brief episodes that lasted 1-2 hours. She presented to the PCP office in March with an epiosode of irregular heart beat but by the time she got there, EKG showed SR. She presented to her PCP office yesterday and was found to have afib with rvr at 138 bpm. She was started on metoprolol and asked to be seen here today.  Now in SR.She does have a CHA2DS2VASc of 2  and would by guidelines be a candidate for anticoagulant. No bleeding issues.  Will stop asa. No previous echo.  Lifestyle issues reviewed and pt  states she is active at the gym, normal weight, no snoring history, no tobacco, does drink two alcoholic drinks a night.She was started on eliquis and echo was ordered.  She returns 09/17/15, no further afib, but is c/o fatigue probably from metoprolol. Echo showed normal structure heart. No bleeding issues with eliquis.  F/u 12/23/16- She reports that she is doing well. Only had one episode of irregular heart beat that resolved within one hour with an extra metoprolol in the last three months. Cutting dose of metoprolol helped with the feeling of fatigue. No issues with  DOAC.   F/u in afib clinic 12/12, she reports in 9 months, 2 episodes of afib both lasting less than 2 hours each, converted with extra dose of metoprolol.No issues with eliquis.  F/u in afib clinic, 12/12.  She has done well over the last year. Has had 2 episodes of afib lasted less than 2 hours. Usually  Takes an extra metoprolol for this. Doing well on her DOAC, no signsof bleeding. WIll obtain labs(CBC/BMET) drawn with PCP this past June for review.  F/u in afib clinic, 04/10/19, for yearly visit. She has had 3 episodes over the last year. Each converted within the hour with extra  BB. She also reports that she has had several episodes of feeling discomfort between her shoulder blades with a feeling of having to burp over the last few months. She had this several years ago and stress test was normal. The d/c will be relieved  with a Tums. Usually is close to after eating with exertional activities. She has just downsized and moved last week, a lot of heavy lifting with boxes.  No issues with bleeding with eliquis 5 mg bid with a CHA2DS2VASc score of 3, (age and turns 89 on the 27th)  F/u 04/08/20. She has had 4 episodes of afib over the last year. Last  around 4 hours at a time. Converts with an extra dose of BB. The chest discomfort she described last year during visit improved. The only time she notices"indigestion" is when she ate late and then did an AM exercise class. The chest burning is relived with one tum. Otherwise no complaints and continues on DOAC without issues with a CHA2DS2VASc score of 3. BP is elevated today but runs [id number] systolic at home.   F/u 04/09/21. She  has only had 2-3 episodes of afib over the last year with episodes lasting less than one hour after taking an extra 1/2 tab of metoprolol Her BP has been creeping up and her PCP wanted to add amlodipine but she opted to increase her metoprolol to a  full 25 mg bid. BP's at home look acceptable. BP elevated here so advised for her to go home and check later today. If her BP's are consistently  running over [id number] systolic then amlodipine would be appropriate to add.   Today, she denies symptoms of palpitations, chest pain, shortness of breath, orthopnea, PND, lower extremity edema, dizziness, presyncope, syncope, or neurologic sequela. The patient is tolerating medications without difficulties and is otherwise without complaint today.   No past medical history on file.   Current Outpatient Medications  Medication Sig Dispense Refill   calcium carbonate (TITRALAC) 420 MG CHEW chewable tablet Chew 420 mg by  mouth as needed.     Calcium Carbonate-Vitamin D 600-3.125 MG-MCG TABS Indications: osteoporosis, a condition of weak bones. Take one twice a day     cetirizine (ZYRTEC) 10 MG tablet Take 10 mg by mouth daily.     Collagen Hydrolysate, Bovine, POWD 1 scoop by mouth daily     ELIQUIS 5 MG TABS tablet TAKE 1 TABLET TWICE A DAY 180 tablet 3   Estradiol 10 MCG TABS vaginal tablet Place 1 tablet vaginally 2 (two) times a week.     fluticasone (FLONASE) 50 MCG/ACT nasal spray Place 1 spray into both nostrils as needed for allergies or rhinitis.     Magnesium 250 MG TABS Take 1 tablet by mouth daily.     Multiple Vitamin (MULTIVITAMIN) tablet Take 1 tablet by mouth daily.     omega-3 acid ethyl esters (LOVAZA) 1 g capsule Take 1 g by mouth daily. Includes Krill oil     Probiotic Product ([employer] DAILY PROBIOTIC PO) Take 1 tablet by mouth daily.     Propylene Glycol (SYSTANE COMPLETE) 0.6 % SOLN as needed.     rosuvastatin (CRESTOR) 10 MG tablet Take 10 mg by mouth at bedtime.     metoprolol tartrate (LOPRESSOR) 25 MG tablet Take 1 tablet (25 mg total) by mouth 2 (two) times daily.     No current facility-administered medications for this encounter.    Allergies  Allergen Reactions   Other     alloway eye drops    Social History   Socioeconomic History   Marital status: Married    Spouse name: Not on file   Number of children: Not on file   Years of education: Not on file   Highest education level: Not on file  Occupational History   Not on file  Tobacco Use   Smoking status: Former   Smokeless tobacco: Never  Substance and Sexual Activity   Alcohol use: Yes    Alcohol/week: 2.0 standard drinks    Types: 2 Standard drinks or equivalent per week    Comment: daily   Drug use: Never   Sexual activity: Not on file  Other Topics Concern   Not on file  Social History Narrative   Not on file   Social Determinants of Health   Financial Resource Strain: Not on file  Food Insecurity:  Not on file  Transportation Needs: Not on file  Physical Activity: Not on file  Stress: Not on file  Social Connections: Not on file  Intimate Partner Violence: Not on file    No family history on file.  ROS- All systems are reviewed and negative except as per the HPI above  Physical Exam: Vitals:   04/09/21 0904  Weight: 59.3 kg  Height: 5\' 3"  (1.6 m)    GEN- The patient is well appearing, alert and oriented x 3 today.  Head- normocephalic, atraumatic Eyes-  Sclera clear, conjunctiva pink Ears- hearing intact Oropharynx- clear Neck- supple, no JVP Lymph- no cervical lymphadenopathy Lungs- Clear to ausculation bilaterally, normal work of breathing Heart- Regular rate and rhythm, no murmurs, rubs or gallops, PMI not laterally displaced GI- soft, NT, ND, + BS Extremities- no clubbing, cyanosis, or edema MS- no significant deformity or atrophy Skin- no rash or lesion Psych- euthymic mood, full affect Neuro- strength and sensation are intact  EKG- Vent. rate 66 BPM PR interval 124 ms QRS duration 64 ms QT/QTcB 408/427 ms P-R-T axes 76 96 47 Normal sinus rhythm Rightward axis Borderline ECG   - Left ventricle: The cavity size was normal. Systolic function was   normal. The estimated ejection fraction was in the range of 55%   to 60%. Wall motion was normal; there were no regional wall   motion abnormalities. Left ventricular diastolic function   parameters were normal. - Mitral valve: There was mild regurgitation.  Assessment and Plan: 1. PAF with very low AF burden Continue metoprolol 25 mg bid  Can take an extra 1/2 tab of metoprolol for breakthrough episodes  2. Chadsvasc score of 3 Continue eliquis 5 mg bid  No bleeding issues  3. HTN Continue to check BP's at home and go with PCP recommendation to add amlodipine if BP stays above 140 systolic    F/u one year in afib clinic, sooner if burden increases    Lupita Leash [facility] [address] [place], [place] [address] [phone]

## 2021-05-30 ENCOUNTER — Other Ambulatory Visit (HOSPITAL_COMMUNITY): Payer: Self-pay | Admitting: Nurse Practitioner

## 2021-06-12 ENCOUNTER — Telehealth: Payer: Self-pay | Admitting: *Deleted

## 2021-06-12 NOTE — Telephone Encounter (Signed)
Message sent to [facility] for NEW PT APPT for pre op.

## 2021-06-12 NOTE — Telephone Encounter (Signed)
WILL SEND AS FYI TO PRE OP AS WELL THAT CLEARANCE REQUEST WAS RECEIVED TODAY.

## 2021-06-12 NOTE — Telephone Encounter (Signed)
° °  Pre-operative Risk Assessment    Patient Name: Katrina Oliver  DOB: 04/03/1946 MRN: [id number]    PT IS GOING TO NEED A NEW PT WITH ONE OF OUR EP MD'S AS PT IS FOLLOWED BY OUR A-FIB CLINIC. I WILL HAVE EP SCHEDULER [place] REACH OUT TO THE PT WITH A NEW PT APPT. I WILL UPDATE THE SURGEON'S OFFICE PT WILL NEED NEW PT APPT WITH EP  Request for Surgical Clearance    Procedure:   RIGHT TOTAL HIP ARTHROPLASTY  Date of Surgery:  Clearance TBD                                 Surgeon:  DR. Rod Can Surgeon's Group or Practice Name:  [facility] Phone number:  [phone] Fax number:  [phone] ATTN: KERRI MAZE   Type of Clearance Requested:   - Medical  - Pharmacy:  Hold Apixaban (Eliquis)     Type of Anesthesia:  Spinal   Additional requests/questions:    Jiles Prows   06/12/2021, 3:38 PM

## 2021-06-17 NOTE — Telephone Encounter (Signed)
I s/w [place], EP scheduler, who did want to let me know that she and Dennis Bast, RN clinic supervisor did get clarification from Dr. Ladona Ridgel in regard to if pt is referred from the A-fib clinic to our office, does this need to be an appt specifically with EP due to A-fib.   It was told to me today that per Dr. Ladona Ridgel pt's cane be seen with any cardiologist as they are being deen by A-fib clinic as well. Pt can be scheduled with a gen card as NEW PT, referral was sent over to our office for appt. We will reach out to the pt with an appt for NEW Pt appt with Gen card.

## 2021-06-17 NOTE — Telephone Encounter (Signed)
I s/w the pt and she is agreeable to NEW PT APPT with Gen Card per referral from A-fib for pre op clearance. Pt has been scheduled to see Dr. Burt Knack 06/25/21 @ 3:40 pm. I will forward notes as FYI to surgeon's office pt has NEW PT APPT 06/25/21 with Dr. Burt Knack. Pt is aware of location for Dr. Burt Knack on [address]. Pt thanked me for the call and the help.

## 2021-06-25 ENCOUNTER — Encounter: Payer: Self-pay | Admitting: Cardiovascular Disease

## 2021-06-25 ENCOUNTER — Other Ambulatory Visit: Payer: Self-pay

## 2021-06-25 ENCOUNTER — Ambulatory Visit: Payer: Federal, State, Local not specified - PPO | Admitting: Cardiovascular Disease

## 2021-06-25 VITALS — BP 164/84 | HR 75 | Ht 63.0 in | Wt 131.2 lb

## 2021-06-25 DIAGNOSIS — Z01810 Encounter for preprocedural cardiovascular examination: Secondary | ICD-10-CM

## 2021-06-25 DIAGNOSIS — I480 Paroxysmal atrial fibrillation: Secondary | ICD-10-CM

## 2021-06-25 NOTE — Patient Instructions (Signed)
Medication Instructions:  ?Your physician recommends that you continue on your current medications as directed. Please refer to the Current Medication list given to you today. ? ?*If you need a refill on your cardiac medications before your next appointment, please call your pharmacy* ? ? ?Lab Work: ?NONE ?If you have labs (blood work) drawn today and your tests are completely normal, you will receive your results only by: ?MyChart Message (if you have MyChart) OR ?A paper copy in the mail ?If you have any lab test that is abnormal or we need to change your treatment, we will call you to review the results. ? ? ?Testing/Procedures: ?CLEARED FOR R HIP REPLACEMENT SURGERY** ? ? ?Follow-Up: ?At [facility], you and your health needs are our priority.  As part of our continuing mission to provide you with exceptional heart care, we have created designated Provider Care Teams.  These Care Teams include your primary Cardiologist (physician) and Advanced Practice Providers (APPs -  Physician Assistants and Nurse Practitioners) who all work together to provide you with the care you need, when you need it. ? ?Your next appointment:   ?1 year(s) ? ?The format for your next appointment:   ?In Person ? ?Provider:   ?You will follow up in the Atrial Fibrillation Clinic located at [facility]. ?Your provider will be: ?Clint R. Fenton, PA-C  ? ? ?  ?

## 2021-06-25 NOTE — Progress Notes (Signed)
Cardiology Office Note:    Date:  06/25/2021   ID:  Katrina Oliver, DOB 08-Mar-1946, MRN [id number]  PCP:  Maurice Small, MD   [facility] Providers Cardiologist:  None     Referring MD: Maurice Small, MD   Chief Complaint  Patient presents with   Atrial Fibrillation    History of Present Illness:    Katrina Oliver is a 76 y.o. female with a hx of paroxysmal atrial fibrillation, presenting for preoperative cardiovascular evaluation.  The patient was first diagnosed with atrial fibrillation in 2017 and she has been followed in the atrial fibrillation clinic since that time by Roderic Palau.  She has tolerated oral anticoagulation with apixaban without bleeding problems.  She has done well with no hospitalizations related to atrial fibrillation.  She had a baseline echo showing normal LV and RV function and no significant valvular disease.  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 Lyla Glassing.  She presents today for preoperative cardiac clearance.  The patient is here alone today.  She is doing well from a cardiac perspective.  She denies chest pain, chest pressure, orthopnea, PND, leg swelling, lightheadedness, or syncope.  She has mild exertional dyspnea when walking any prolonged distance but relates this to her hip pain in the effort involved move her right leg.  She has occasional heart palpitations but has not had any feelings reminiscent of her atrial fibrillation over recent months.  When she feels anything coming on she takes an additional metoprolol which relieves her symptoms.  Overall she feels stable from a cardiac perspective and has not had any new symptoms.  Past Medical History:  Diagnosis Date   A-fib ([place])    Aortic atherosclerosis (HCC)    Hip pain    HTN (hypertension)    Hypercholesteremia    Osteoarthritis    PAF (paroxysmal atrial fibrillation) (HCC)     Past  Surgical History:  Procedure Laterality Date   RIGHT OOPHORECTOMY     TUBAL LIGATION      Current Medications: Current Meds  Medication Sig   calcium carbonate (TITRALAC) 420 MG CHEW chewable tablet Chew 420 mg by mouth as needed.   Calcium Carbonate-Vitamin D 600-3.125 MG-MCG TABS Indications: osteoporosis, a condition of weak bones. Take one twice a day   cetirizine (ZYRTEC) 10 MG tablet Take 10 mg by mouth daily.   Collagen Hydrolysate, Bovine, POWD 1 scoop by mouth daily   ELIQUIS 5 MG TABS tablet TAKE 1 TABLET TWICE A DAY   Estradiol 10 MCG TABS vaginal tablet Place 1 tablet vaginally 2 (two) times a week.   fluticasone (FLONASE) 50 MCG/ACT nasal spray Place 1 spray into both nostrils as needed for allergies or rhinitis.   Magnesium 250 MG TABS Take 1 tablet by mouth daily.   metoprolol tartrate (LOPRESSOR) 25 MG tablet Take 1 tablet (25 mg total) by mouth 2 (two) times daily.   Multiple Vitamin (MULTIVITAMIN) tablet Take 1 tablet by mouth daily.   omega-3 acid ethyl esters (LOVAZA) 1 g capsule Take 1 g by mouth daily. Includes Krill oil   Probiotic Product ([employer] DAILY PROBIOTIC PO) Take 1 tablet by mouth daily.   Propylene Glycol (SYSTANE COMPLETE) 0.6 % SOLN as needed.   rosuvastatin (CRESTOR) 10 MG tablet Take 10 mg by mouth at bedtime.     Allergies:   Other   Social History   Socioeconomic History  Marital status: Married    Spouse name: Not on file   Number of children: Not on file   Years of education: Not on file   Highest education level: Not on file  Occupational History   Not on file  Tobacco Use   Smoking status: Former   Smokeless tobacco: Never  Substance and Sexual Activity   Alcohol use: Yes    Alcohol/week: 2.0 standard drinks    Types: 2 Standard drinks or equivalent per week    Comment: daily   Drug use: Never   Sexual activity: Not on file  Other Topics Concern   Not on file  Social History Narrative   Not on file   Social Determinants of  Health   Financial Resource Strain: Not on file  Food Insecurity: Not on file  Transportation Needs: Not on file  Physical Activity: Not on file  Stress: Not on file  Social Connections: Not on file     Family History: The patient's family history is not on file.  ROS:   Please see the history of present illness.    All other systems reviewed and are negative.  EKGs/Labs/Other Studies Reviewed:    The following studies were reviewed today: 2D echo 09/05/2015: Study Conclusions   - Left ventricle: The cavity size was normal. Systolic function was    normal. The estimated ejection fraction was in the range of 55%    to 60%. Wall motion was normal; there were no regional wall    motion abnormalities. Left ventricular diastolic function    parameters were normal.  - Mitral valve: There was mild regurgitation.   EKG:  EKG is ordered today.  The ekg ordered today demonstrates normal sinus rhythm, nonspecific ST and T wave abnormality.  Left atrial enlargement.  Recent Labs: 04/09/2021: Hemoglobin 14.4; Platelets 240  Recent Lipid Panel No results found for: CHOL, TRIG, HDL, CHOLHDL, VLDL, LDLCALC, LDLDIRECT   Risk Assessment/Calculations:    CHA2DS2-VASc Score = 3   This indicates a 3.2% annual risk of stroke. The patient's score is based upon: CHF History: 0 HTN History: 0 Diabetes History: 0 Stroke History: 0 Vascular Disease History: 0 Age Score: 2 Gender Score: 1          Physical Exam:    VS:  BP (!) 164/84    Pulse 75    Ht 5\' 3"  (1.6 m)    Wt 131 lb 3.2 oz (59.5 kg)    SpO2 97%    BMI 23.24 kg/m     Wt Readings from Last 3 Encounters:  06/25/21 131 lb 3.2 oz (59.5 kg)  04/09/21 130 lb 12.8 oz (59.3 kg)  04/08/20 130 lb 9.6 oz (59.2 kg)     GEN:  Well nourished, well developed in no acute distress HEENT: Normal NECK: No JVD; No carotid bruits LYMPHATICS: No lymphadenopathy CARDIAC: RRR, no murmurs, rubs, gallops RESPIRATORY:  Clear to auscultation  without rales, wheezing or rhonchi  ABDOMEN: Soft, non-tender, non-distended MUSCULOSKELETAL:  No edema; No deformity  SKIN: Warm and dry NEUROLOGIC:  Alert and oriented x 3 PSYCHIATRIC:  Normal affect   ASSESSMENT:    1. Paroxysmal atrial fibrillation (HCC)   2. Preop cardiovascular exam    PLAN:    In order of problems listed above:  The patient is clinically stable.  She will continue metoprolol.  She will continue apixaban for prevention of thromboembolic disease.  She has no history of stroke or TIA.  She has no indication  for bridging prior to surgery.  She is at acceptable risk to hold apixaban 48 hours prior to surgery if indicated.  The patient has no high risk features for hip replacement as she can achieve an acceptable workload with no symptoms of angina.  I have not recommended any further cardiac testing.  She has no signs of congestive heart failure, angina, or symptomatic arrhythmia.  She understands that she will be at elevated risk of postoperative atrial fibrillation with her background history of PAF.  She should take her metoprolol the morning of surgery and continue it in the perioperative period. As above, at low cardiac risk of total hip arthroplasty.  Okay to proceed as outlined.     For follow-up, the patient will continue to follow annually in the atrial fibrillation clinic.  I would be happy to see her at any time if problems arise.    Medication Adjustments/Labs and Tests Ordered: Current medicines are reviewed at length with the patient today.  Concerns regarding medicines are outlined above.  Orders Placed This Encounter  Procedures   EKG 12-Lead   No orders of the defined types were placed in this encounter.   Patient Instructions  Medication Instructions:  Your physician recommends that you continue on your current medications as directed. Please refer to the Current Medication list given to you today.  *If you need a refill on your cardiac  medications before your next appointment, please call your pharmacy*   Lab Work: NONE If you have labs (blood work) drawn today and your tests are completely normal, you will receive your results only by: [place] (if you have MyChart) OR A paper copy in the mail If you have any lab test that is abnormal or we need to change your treatment, we will call you to review the results.   Testing/Procedures: CLEARED FOR R HIP REPLACEMENT SURGERY**   Follow-Up: At [facility], you and your health needs are our priority.  As part of our continuing mission to provide you with exceptional heart care, we have created designated Provider Care Teams.  These Care Teams include your primary Cardiologist (physician) and Advanced Practice Providers (APPs -  Physician Assistants and Nurse Practitioners) who all work together to provide you with the care you need, when you need it.  Your next appointment:   1 year(s)  The format for your next appointment:   In Person  Provider:   You will follow up in the [facility] located at [facility]. Your provider will be: Clint R. Marlene Lard, PA-C        Signed, Sherren Mocha, MD  06/25/2021 7:43 PM    [facility]

## 2021-07-01 ENCOUNTER — Telehealth: Payer: Self-pay | Admitting: *Deleted

## 2021-07-01 DIAGNOSIS — Z860100 Personal history of colon polyps, unspecified: Secondary | ICD-10-CM | POA: Insufficient documentation

## 2021-07-01 DIAGNOSIS — I700 Atherosclerosis of aorta: Secondary | ICD-10-CM | POA: Insufficient documentation

## 2021-07-01 DIAGNOSIS — E7800 Pure hypercholesterolemia, unspecified: Secondary | ICD-10-CM | POA: Insufficient documentation

## 2021-07-01 DIAGNOSIS — E559 Vitamin D deficiency, unspecified: Secondary | ICD-10-CM | POA: Insufficient documentation

## 2021-07-01 DIAGNOSIS — I10 Essential (primary) hypertension: Secondary | ICD-10-CM | POA: Insufficient documentation

## 2021-07-01 DIAGNOSIS — I480 Paroxysmal atrial fibrillation: Secondary | ICD-10-CM | POA: Insufficient documentation

## 2021-07-01 NOTE — Telephone Encounter (Signed)
Patient with diagnosis of afib on Eliquis for anticoagulation.   ? ?Procedure: right THA ?Date of procedure: TBD ? ?CHA2DS2-VASc Score = 5  ?This indicates a 7.2% annual risk of stroke. ?The patient's score is based upon: ?CHF History: 0 ?HTN History: 1 ?Diabetes History: 0 ?Stroke History: 0 ?Vascular Disease History: 1 ?Age Score: 2 ?Gender Score: 1 ? ?CrCl 37mL/min ?Platelet count 240K ? ?Pt can hold Eliquis for 3 days prior to procedure as requested. ?

## 2021-07-01 NOTE — Telephone Encounter (Signed)
Patient was cleared for Eliquis hold for 48 hours by Dr. Excell Seltzer on 06/25/21. Received call from [facility] today with request to hold Eliquis for 72 hours due to spinal anesthesia. Advised that I will forward to clinical pharmacist for review as Dr. Excell Seltzer is out of the office today.  ?

## 2021-07-01 NOTE — Telephone Encounter (Signed)
Received call from Dr. Jovita Gamma office.  They received the clearance from Dr. Excell Seltzer, with the ok to hold Eliquis 48 hours prior and they need to see if pt can hold 72 hour prior, as this is what he requires. ? ?Surgeon:  DR. Samson Frederic ?Surgeon's Group or Practice Name:  [facility] ?Phone number:  [phone] ?Fax number:  [phone] ATTN: KERRI MAZE ?

## 2021-07-01 NOTE — Telephone Encounter (Signed)
Spoke with Lynnea Ferrier at [facility]. Dr. Linna Caprice is doing spinal anesthesia and has requested a 72 hour hold for Eliquis. Dr. Earmon Phoenix note from 06/25/21 states patient may hold Eliquis for 48 hours as there was no specific time request on the initial clearance request. Dr. Excell Seltzer is out of the office today so I advised Lynnea Ferrier I will send to our clinical pharmacist for review and resend the clearance once complete. She verbalized understanding and agreement.  ?**please see Telephone Encounter 06/12/21 Clearance TBD for additional documentation.  ?

## 2021-07-03 ENCOUNTER — Ambulatory Visit: Payer: Self-pay | Admitting: Orthopedic Surgery

## 2021-07-14 ENCOUNTER — Ambulatory Visit: Payer: Self-pay | Admitting: Student

## 2021-07-14 NOTE — H&P (View-Only) (Signed)
TOTAL HIP ADMISSION H&P ? ?Patient is admitted for right total hip arthroplasty. ? ?Subjective: ? ?Chief Complaint: right hip pain ? ?HPI: Katrina Oliver, 77 y.o. female, has a history of pain and functional disability in the right hip(s) due to arthritis and patient has failed non-surgical conservative treatments for greater than 12 weeks to include NSAID's and/or analgesics, flexibility and strengthening excercises, supervised PT with diminished ADL's post treatment, use of assistive devices, weight reduction as appropriate, and activity modification.  Onset of symptoms was gradual starting 10 years ago with rapidlly worsening course since that time.The patient noted no past surgery on the right hip(s).  Patient currently rates pain in the right hip at 10 out of 10 with activity. Patient has night pain, worsening of pain with activity and weight bearing, pain that interfers with activities of daily living, pain with passive range of motion, and joint swelling. Patient has evidence of subchondral cysts, subchondral sclerosis, periarticular osteophytes, and joint space narrowing by imaging studies. This condition presents safety issues increasing the risk of falls.  There is no current active infection. ? ?Patient Active Problem List  ? Diagnosis Date Noted  ? Hardening of the aorta (main artery of the heart) ([place]) 07/01/2021  ? Paroxysmal atrial fibrillation ([place]) 07/01/2021  ? Primary hypertension 07/01/2021  ? Pure hypercholesterolemia 07/01/2021  ? Vitamin D deficiency 07/01/2021  ? Personal history of colonic polyps 07/01/2021  ? Encounter for screening for lung cancer 12/05/2019  ? ?Past Medical History:  ?Diagnosis Date  ? A-fib ([place])   ? Aortic atherosclerosis ([place])   ? Hip pain   ? HTN (hypertension)   ? Hypercholesteremia   ? Osteoarthritis   ? PAF (paroxysmal atrial fibrillation) ([place])   ?  ?Past Surgical History:  ?Procedure Laterality Date  ? RIGHT OOPHORECTOMY    ? TUBAL LIGATION    ?  ?Current  Outpatient Medications  ?Medication Sig Dispense Refill Last Dose  ? Calcium Carbonate-Vitamin D 600-3.125 MG-MCG TABS Take 1 tablet by mouth daily.     ? cetirizine (ZYRTEC) 10 MG tablet Take 10 mg by mouth daily.     ? COLLAGEN-VITAMIN C PO Take 2 capsules by mouth daily.     ? ELIQUIS 5 MG TABS tablet TAKE 1 TABLET TWICE A DAY 180 tablet 3   ? Estradiol 10 MCG TABS vaginal tablet Place 1 tablet vaginally 2 (two) times a week.     ? fluticasone (FLONASE) 50 MCG/ACT nasal spray Place 1 spray into both nostrils as needed for allergies or rhinitis.     ? Krill Oil 350 MG CAPS Take 350 mg by mouth daily.     ? Magnesium 250 MG TABS Take 250 mg by mouth daily.     ? metoprolol tartrate (LOPRESSOR) 25 MG tablet Take 1 tablet (25 mg total) by mouth 2 (two) times daily. 180 tablet 3   ? metroNIDAZOLE (METROGEL) 0.75 % gel Apply 1 application. topically daily.     ? Multiple Vitamin (MULTIVITAMIN) tablet Take 1 tablet by mouth daily.     ? Probiotic Product ([employer]S DAILY PROBIOTIC PO) Take 1 capsule by mouth daily.     ? Propylene Glycol (SYSTANE COMPLETE) 0.6 % SOLN Place 1 drop into both eyes 2 (two) times daily as needed (dry eyes).     ? rosuvastatin (CRESTOR) 10 MG tablet Take 10 mg by mouth at bedtime.     ? ?No current facility-administered medications for this visit.  ? ?Allergies  ?Allergen Reactions  ?  Alaway [Ketotifen Fumarate]   ?  Eye drops - causes eye irritation, redness and swelling   ?  ?Social History  ? ?Tobacco Use  ? Smoking status: Former  ? Smokeless tobacco: Never  ?Substance Use Topics  ? Alcohol use: Yes  ?  Alcohol/week: 2.0 standard drinks  ?  Types: 2 Standard drinks or equivalent per week  ?  Comment: daily  ?  ?No family history on file.  ? ?Review of Systems  ?Musculoskeletal:  Positive for arthralgias, gait problem and joint swelling.  ?All other systems reviewed and are negative. ? ?Objective: ? ?Physical Exam ?Constitutional:   ?   Appearance: Normal appearance. She is normal weight.   ?HENT:  ?   Head: Normocephalic and atraumatic.  ?   Nose: Nose normal.  ?   Mouth/Throat:  ?   Mouth: Mucous membranes are moist.  ?   Pharynx: Oropharynx is clear.  ?Eyes:  ?   Extraocular Movements: Extraocular movements intact.  ?   Conjunctiva/sclera: Conjunctivae normal.  ?   Pupils: Pupils are equal, round, and reactive to light.  ?Cardiovascular:  ?   Rate and Rhythm: Normal rate and regular rhythm.  ?   Pulses: Normal pulses.  ?Pulmonary:  ?   Effort: Pulmonary effort is normal.  ?   Breath sounds: Normal breath sounds.  ?Abdominal:  ?   General: Abdomen is flat.  ?   Palpations: Abdomen is soft.  ?Genitourinary: ?   Comments: deferred ?Musculoskeletal:  ?   Cervical back: Normal range of motion and neck supple.  ?   Right hip: Tenderness and bony tenderness present. Decreased range of motion.  ?Skin: ?   General: Skin is warm and dry.  ?Neurological:  ?   General: No focal deficit present.  ?   Mental Status: She is alert and oriented to person, place, and time.  ?Psychiatric:     ?   Mood and Affect: Mood normal.     ?   Behavior: Behavior normal.     ?   Thought Content: Thought content normal.     ?   Judgment: Judgment normal.  ? ? ?Vital signs in last 24 hours: ?@VSRANGES @ ? ?Labs: ? ? ?Estimated body mass index is 23.24 kg/m? as calculated from the following: ?  Height as of 06/25/21: 5\' 3"  (1.6 m). ?  Weight as of 06/25/21: 59.5 kg. ? ? ?Imaging Review ?Plain radiographs demonstrate severe degenerative joint disease of the right hip(s). The bone quality appears to be adequate for age and reported activity level. ? ? ? ? ? ?Assessment/Plan: ? ?End stage arthritis, right hip(s) ? ?The patient history, physical examination, clinical judgement of the provider and imaging studies are consistent with end stage degenerative joint disease of the right hip(s) and total hip arthroplasty is deemed medically necessary. The treatment options including medical management, injection therapy, arthroscopy and  arthroplasty were discussed at length. The risks and benefits of total hip arthroplasty were presented and reviewed. The risks due to aseptic loosening, infection, stiffness, dislocation/subluxation,  thromboembolic complications and other imponderables were discussed.  The patient acknowledged the explanation, agreed to proceed with the plan and consent was signed. Patient is being admitted for inpatient treatment for surgery, pain control, PT, OT, prophylactic antibiotics, VTE prophylaxis, progressive ambulation and ADL's and discharge planning.The patient is planning to be discharged home after overnight stay at [facility] due to afib and on Eliquis at baseline. Patient will do home exercise program with standing side  leg raises and stationary bike/elliptical.  ? ? ? ?Patient's anticipated LOS is less than 2 midnights, meeting these requirements: ?- Younger than 34 ?- Lives within 1 hour of care ?- Has a competent adult at home to recover with post-op recover ?- NO history of ? - Chronic pain requiring opiods ? - Diabetes ? - Coronary Artery Disease ? - Heart failure ? - Heart attack ? - Stroke ? - DVT/VTE ? - Cardiac arrhythmia ? - Respiratory Failure/COPD ? - Renal failure ? - Anemia ? - Advanced Liver disease ? ? ?

## 2021-07-14 NOTE — H&P (Signed)
TOTAL HIP ADMISSION H&P ? ?Patient is admitted for right total hip arthroplasty. ? ?Subjective: ? ?Chief Complaint: right hip pain ? ?HPI: Katrina Oliver, 76 y.o. female, has a history of pain and functional disability in the right hip(s) due to arthritis and patient has failed non-surgical conservative treatments for greater than 12 weeks to include NSAID's and/or analgesics, flexibility and strengthening excercises, supervised PT with diminished ADL's post treatment, use of assistive devices, weight reduction as appropriate, and activity modification.  Onset of symptoms was gradual starting 10 years ago with rapidlly worsening course since that time.The patient noted no past surgery on the right hip(s).  Patient currently rates pain in the right hip at 10 out of 10 with activity. Patient has night pain, worsening of pain with activity and weight bearing, pain that interfers with activities of daily living, pain with passive range of motion, and joint swelling. Patient has evidence of subchondral cysts, subchondral sclerosis, periarticular osteophytes, and joint space narrowing by imaging studies. This condition presents safety issues increasing the risk of falls.  There is no current active infection. ? ?Patient Active Problem List  ? Diagnosis Date Noted  ? Hardening of the aorta (main artery of the heart) ([place]) 07/01/2021  ? Paroxysmal atrial fibrillation ([place]) 07/01/2021  ? Primary hypertension 07/01/2021  ? Pure hypercholesterolemia 07/01/2021  ? Vitamin D deficiency 07/01/2021  ? Personal history of colonic polyps 07/01/2021  ? Encounter for screening for lung cancer 12/05/2019  ? ?Past Medical History:  ?Diagnosis Date  ? A-fib ([place])   ? Aortic atherosclerosis ([place])   ? Hip pain   ? HTN (hypertension)   ? Hypercholesteremia   ? Osteoarthritis   ? PAF (paroxysmal atrial fibrillation) ([place])   ?  ?Past Surgical History:  ?Procedure Laterality Date  ? RIGHT OOPHORECTOMY    ? TUBAL LIGATION    ?  ?Current  Outpatient Medications  ?Medication Sig Dispense Refill Last Dose  ? Calcium Carbonate-Vitamin D 600-3.125 MG-MCG TABS Take 1 tablet by mouth daily.     ? cetirizine (ZYRTEC) 10 MG tablet Take 10 mg by mouth daily.     ? COLLAGEN-VITAMIN C PO Take 2 capsules by mouth daily.     ? ELIQUIS 5 MG TABS tablet TAKE 1 TABLET TWICE A DAY 180 tablet 3   ? Estradiol 10 MCG TABS vaginal tablet Place 1 tablet vaginally 2 (two) times a week.     ? fluticasone (FLONASE) 50 MCG/ACT nasal spray Place 1 spray into both nostrils as needed for allergies or rhinitis.     ? Krill Oil 350 MG CAPS Take 350 mg by mouth daily.     ? Magnesium 250 MG TABS Take 250 mg by mouth daily.     ? metoprolol tartrate (LOPRESSOR) 25 MG tablet Take 1 tablet (25 mg total) by mouth 2 (two) times daily. 180 tablet 3   ? metroNIDAZOLE (METROGEL) 0.75 % gel Apply 1 application. topically daily.     ? Multiple Vitamin (MULTIVITAMIN) tablet Take 1 tablet by mouth daily.     ? Probiotic Product ([employer] DAILY PROBIOTIC PO) Take 1 capsule by mouth daily.     ? Propylene Glycol (SYSTANE COMPLETE) 0.6 % SOLN Place 1 drop into both eyes 2 (two) times daily as needed (dry eyes).     ? rosuvastatin (CRESTOR) 10 MG tablet Take 10 mg by mouth at bedtime.     ? ?No current facility-administered medications for this visit.  ? ?Allergies  ?Allergen Reactions  ?  Alaway [Ketotifen Fumarate]   ?  Eye drops - causes eye irritation, redness and swelling   ?  ?Social History  ? ?Tobacco Use  ? Smoking status: Former  ? Smokeless tobacco: Never  ?Substance Use Topics  ? Alcohol use: Yes  ?  Alcohol/week: 2.0 standard drinks  ?  Types: 2 Standard drinks or equivalent per week  ?  Comment: daily  ?  ?No family history on file.  ? ?Review of Systems  ?Musculoskeletal:  Positive for arthralgias, gait problem and joint swelling.  ?All other systems reviewed and are negative. ? ?Objective: ? ?Physical Exam ?Constitutional:   ?   Appearance: Normal appearance. She is normal weight.   ?HENT:  ?   Head: Normocephalic and atraumatic.  ?   Nose: Nose normal.  ?   Mouth/Throat:  ?   Mouth: Mucous membranes are moist.  ?   Pharynx: Oropharynx is clear.  ?Eyes:  ?   Extraocular Movements: Extraocular movements intact.  ?   Conjunctiva/sclera: Conjunctivae normal.  ?   Pupils: Pupils are equal, round, and reactive to light.  ?Cardiovascular:  ?   Rate and Rhythm: Normal rate and regular rhythm.  ?   Pulses: Normal pulses.  ?Pulmonary:  ?   Effort: Pulmonary effort is normal.  ?   Breath sounds: Normal breath sounds.  ?Abdominal:  ?   General: Abdomen is flat.  ?   Palpations: Abdomen is soft.  ?Genitourinary: ?   Comments: deferred ?Musculoskeletal:  ?   Cervical back: Normal range of motion and neck supple.  ?   Right hip: Tenderness and bony tenderness present. Decreased range of motion.  ?Skin: ?   General: Skin is warm and dry.  ?Neurological:  ?   General: No focal deficit present.  ?   Mental Status: She is alert and oriented to person, place, and time.  ?Psychiatric:     ?   Mood and Affect: Mood normal.     ?   Behavior: Behavior normal.     ?   Thought Content: Thought content normal.     ?   Judgment: Judgment normal.  ? ? ?Vital signs in last 24 hours: ?@VSRANGES @ ? ?Labs: ? ? ?Estimated body mass index is 23.24 kg/m? as calculated from the following: ?  Height as of 06/25/21: 5\' 3"  (1.6 m). ?  Weight as of 06/25/21: 59.5 kg. ? ? ?Imaging Review ?Plain radiographs demonstrate severe degenerative joint disease of the right hip(s). The bone quality appears to be adequate for age and reported activity level. ? ? ? ? ? ?Assessment/Plan: ? ?End stage arthritis, right hip(s) ? ?The patient history, physical examination, clinical judgement of the provider and imaging studies are consistent with end stage degenerative joint disease of the right hip(s) and total hip arthroplasty is deemed medically necessary. The treatment options including medical management, injection therapy, arthroscopy and  arthroplasty were discussed at length. The risks and benefits of total hip arthroplasty were presented and reviewed. The risks due to aseptic loosening, infection, stiffness, dislocation/subluxation,  thromboembolic complications and other imponderables were discussed.  The patient acknowledged the explanation, agreed to proceed with the plan and consent was signed. Patient is being admitted for inpatient treatment for surgery, pain control, PT, OT, prophylactic antibiotics, VTE prophylaxis, progressive ambulation and ADL's and discharge planning.The patient is planning to be discharged home after overnight stay at [facility] due to afib and on Eliquis at baseline. Patient will do home exercise program with standing side  leg raises and stationary bike/elliptical.  ? ? ? ?Patient's anticipated LOS is less than 2 midnights, meeting these requirements: ?- Younger than 76 ?- Lives within 1 hour of care ?- Has a competent adult at home to recover with post-op recover ?- NO history of ? - Chronic pain requiring opiods ? - Diabetes ? - Coronary Artery Disease ? - Heart failure ? - Heart attack ? - Stroke ? - DVT/VTE ? - Cardiac arrhythmia ? - Respiratory Failure/COPD ? - Renal failure ? - Anemia ? - Advanced Liver disease ? ? ?

## 2021-07-16 NOTE — Patient Instructions (Addendum)
2 VISITORS  (aged 78 and older)  IS ALLOWED TO COME WITH YOU AND STAY IN THE WAITING ROOM ONLY DURING PRE OP AND PROCEDURE.   ?**NO VISITORS ARE ALLOWED IN THE SHORT STAY AREA OR RECOVERY ROOM!!** ? ?IF YOU WILL BE ADMITTED INTO THE HOSPITAL YOU ARE ALLOWED ONLY 4 SUPPORT PEOPLE DURING VISITATION HOURS ONLY (7 AM -8PM)   ?The support person(s) must pass our screening, gel in and out, and wear a mask at all times, including in the patient?s room. ?Patients must also wear a mask when staff or their support person are in the room. ?Visitors GUEST BADGE MUST BE WORN VISIBLY  ?One adult visitor may remain with you overnight and MUST be in the room by 8 P.M. ?  ? ? Your procedure is scheduled on: 07/29/21 ? ? [facility] ? ?  Report to admitting at  8:45 AM ? ? Call this number if you have problems the morning of surgery [phone] ? ? Do not eat food :After Midnight. ? ? After Midnight you may have the following liquids until 8:30__AM DAY OF SURGERY ? ?Water ?Black Coffee (sugar ok, NO MILK/CREAM OR CREAMERS)  ?Tea (sugar ok, NO MILK/CREAM OR CREAMERS) regular and decaf                             ?Plain Jell-O (NO RED)                                           ?Fruit ices (not with fruit pulp, NO RED)                                     ?Popsicles (NO RED)                                                                  ?Juice: apple, WHITE grape, WHITE cranberry ?Sports drinks like Gatorade (NO RED) ?Clear broth(vegetable,chicken,beef) ? ?             ? ?  ?  ?The day of surgery:  ?Drink ONE (1) Pre-Surgery Clear Ensure  at 8:15 AM the morning of surgery. Drink in one sitting. Do not sip.  ?This drink was given to you during your hospital  ?pre-op appointment visit. ?Nothing else to drink after completing the  ?Pre-Surgery Clear Ensure at 8:30 ?  ?       If you have questions, please contact your surgeon?s office. ? ? ?  ?  ?Oral Hygiene is also important to reduce your risk of  infection.                                    ?Remember - BRUSH YOUR TEETH THE MORNING OF SURGERY WITH YOUR REGULAR TOOTHPASTE ? ? Do NOT smoke after Midnight ? ? Take these medicines the morning of surgery with A SIP OF WATER: Metoprolol ? ?Stop taking __Eliquis_________on __4/1________as instructed  by _Dr. Cooper____________. ? ?  ? ?Bring CPAP mask and tubing day of surgery. ?                  ?           You may not have any metal on your body including hair pins, jewelry, and body piercing ? ?           Do not wear make-up, lotions, powders, perfumes/cologne, or deodorant ? ?Do not wear nail polish including gel and S&S, artificial/acrylic nails, or any other type of covering on natural nails including finger and toenails. If you have artificial nails, gel coating, etc. that needs to be removed by a nail salon please have this removed prior to surgery or surgery may need to be canceled/ delayed if the surgeon/ anesthesia feels like they are unable to be safely monitored.  ? ?Do not shave  48 hours prior to surgery.  ? ? ? Do not bring valuables to the hospital. [facility] IS NOT ?            RESPONSIBLE   FOR VALUABLES. ? ? Contacts, dentures or bridgework may not be worn into surgery. ? ? Bring small overnight bag day of surgery. ?  ? Patients discharged on the day of surgery will not be allowed to drive home.  Someone NEEDS to stay with you for the first 24 hours after anesthesia. ? ? Special Instructions: Bring a copy of your healthcare power of attorney and living will documents the day of surgery if you haven't scanned them before. ? ?            Please read over the following fact sheets you were given: IF YOU HAVE QUESTIONS ABOUT YOUR PRE-OP INSTRUCTIONS PLEASE CALL [phone] ? ?   [facility] - Preparing for Surgery ?Before surgery, you can play an important role.  Because skin is not sterile, your skin needs to be as free of germs as possible.  You can reduce the number of germs on your skin by  washing with CHG (chlorahexidine gluconate) soap before surgery.  CHG is an antiseptic cleaner which kills germs and bonds with the skin to continue killing germs even after washing. ?Please DO NOT use if you have an allergy to CHG or antibacterial soaps.  If your skin becomes reddened/irritated stop using the CHG and inform your nurse when you arrive at Short Stay. ?Do not shave (including legs and underarms) for at least 48 hours prior to the first CHG shower.   ?Please follow these instructions carefully: ? 1.  Shower with CHG Soap the night before surgery and the  morning of Surgery. ? 2.  If you choose to wash your hair, wash your hair first as usual with your  normal  shampoo. ? 3.  After you shampoo, rinse your hair and body thoroughly to remove the  shampoo.                            ?4.  Use CHG as you would any other liquid soap.  You can apply chg directly  to the skin and wash  ?                     Gently with a scrungie or clean washcloth. ? 5.  Apply the CHG Soap to your body ONLY FROM THE NECK DOWN.   Do not use on face/  open      ?                     Wound or open sores. Avoid contact with eyes, ears mouth and genitals (private parts).  ?                     Engineering geologist,  Genitals (private parts) with your normal soap. ?            6.  Wash thoroughly, paying special attention to the area where your surgery  will be performed. ? 7.  Thoroughly rinse your body with warm water from the neck down. ? 8.  DO NOT shower/wash with your normal soap after using and rinsing off  the CHG Soap. ?               9.  Pat yourself dry with a clean towel. ?           10.  Wear clean pajamas. ?           11.  Place clean sheets on your bed the night of your first shower and do not  sleep with pets. ?Day of Surgery : ?Do not apply any lotions/deodorants the morning of surgery.  Please wear clean clothes to the hospital/surgery center. ? ?FAILURE TO FOLLOW THESE INSTRUCTIONS MAY RESULT IN THE CANCELLATION OF YOUR  SURGERY ? ?________________________________________________________________________  ? ?Incentive Spirometer ? ?An incentive spirometer is a tool that can help keep your lungs clear and active. This tool measures how well you are filling your lungs with each breath. Taking long deep breaths may help reverse or decrease the chance of developing breathing (pulmonary) problems (especially infection) following: ?A long period of time when you are unable to move or be active. ?BEFORE THE PROCEDURE  ?If the spirometer includes an indicator to show your best effort, your nurse or respiratory therapist will set it to a desired goal. ?If possible, sit up straight or lean slightly forward. Try not to slouch. ?Hold the incentive spirometer in an upright position. ?INSTRUCTIONS FOR USE  ?Sit on the edge of your bed if possible, or sit up as far as you can in bed or on a chair. ?Hold the incentive spirometer in an upright position. ?Breathe out normally. ?Place the mouthpiece in your mouth and seal your lips tightly around it. ?Breathe in slowly and as deeply as possible, raising the piston or the ball toward the top of the column. ?Hold your breath for 3-5 seconds or for as long as possible. Allow the piston or ball to fall to the bottom of the column. ?Remove the mouthpiece from your mouth and breathe out normally. ?Rest for a few seconds and repeat Steps 1 through 7 at least 10 times every 1-2 hours when you are awake. Take your time and take a few normal breaths between deep breaths. ?The spirometer may include an indicator to show your best effort. Use the indicator as a goal to work toward during each repetition. ?After each set of 10 deep breaths, practice coughing to be sure your lungs are clear. If you have an incision (the cut made at the time of surgery), support your incision when coughing by placing a pillow or rolled up towels firmly against it. ?Once you are able to get out of bed, walk around indoors and cough  well. You may stop using the incentive spirometer when instructed by your caregiver.  ?RISKS AND COMPLICATIONS ?  Take your time so you do not get dizzy or light-headed. ?If you are in pain, you may need to take or ask

## 2021-07-17 ENCOUNTER — Encounter (HOSPITAL_COMMUNITY): Payer: Self-pay

## 2021-07-17 ENCOUNTER — Encounter (HOSPITAL_COMMUNITY)
Admission: RE | Admit: 2021-07-17 | Discharge: 2021-07-17 | Disposition: A | Discharge status: Home / Self Care | Payer: Federal, State, Local not specified - PPO | Source: Ambulatory Visit | Attending: Orthopedic Surgery | Admitting: Orthopedic Surgery

## 2021-07-17 ENCOUNTER — Other Ambulatory Visit: Payer: Self-pay

## 2021-07-17 VITALS — BP 182/72 | HR 79 | Temp 98.4°F | Resp 16 | Ht 62.5 in | Wt 130.0 lb

## 2021-07-17 DIAGNOSIS — Z01818 Encounter for other preprocedural examination: Secondary | ICD-10-CM

## 2021-07-17 DIAGNOSIS — Z87891 Personal history of nicotine dependence: Secondary | ICD-10-CM | POA: Insufficient documentation

## 2021-07-17 DIAGNOSIS — M1611 Unilateral primary osteoarthritis, right hip: Secondary | ICD-10-CM | POA: Diagnosis not present

## 2021-07-17 DIAGNOSIS — I480 Paroxysmal atrial fibrillation: Secondary | ICD-10-CM | POA: Diagnosis not present

## 2021-07-17 DIAGNOSIS — Z01812 Encounter for preprocedural laboratory examination: Secondary | ICD-10-CM | POA: Insufficient documentation

## 2021-07-17 DIAGNOSIS — I10 Essential (primary) hypertension: Secondary | ICD-10-CM | POA: Insufficient documentation

## 2021-07-17 HISTORY — DX: Cardiac arrhythmia, unspecified: I49.9

## 2021-07-17 LAB — TYPE AND SCREEN
ABO/RH(D): A POS
Antibody Screen: NEGATIVE

## 2021-07-17 LAB — BASIC METABOLIC PANEL
Anion gap: 10 (ref 5–15)
BUN: 20 mg/dL (ref 8–23)
CO2: 23 mmol/L (ref 22–32)
Calcium: 8.9 mg/dL (ref 8.9–10.3)
Chloride: 106 mmol/L (ref 98–111)
Creatinine, Ser: 0.64 mg/dL (ref 0.44–1.00)
GFR, Estimated: >60 mL/min (ref >60)
Glucose, Bld: 93 mg/dL (ref 70–99)
Potassium: 3.9 mmol/L (ref 3.5–5.1)
Sodium: 139 mmol/L (ref 135–145)

## 2021-07-17 LAB — CBC
HCT: 44.6 % (ref 36.0–46.0)
Hemoglobin: 14.3 g/dL (ref 12.0–15.0)
MCH: 31.6 pg (ref 26.0–34.0)
MCHC: 32.1 g/dL (ref 30.0–36.0)
MCV: 98.5 fL (ref 80.0–100.0)
Platelets: 226 10*3/uL (ref 150–400)
RBC: 4.53 MIL/uL (ref 3.87–5.11)
RDW: 12.5 % (ref 11.5–15.5)
WBC: 4.2 10*3/uL (ref 4.0–10.5)
nRBC: 0 % (ref 0.0–0.2)

## 2021-07-17 LAB — SURGICAL PCR SCREEN
MRSA, PCR: NEGATIVE
Staphylococcus aureus: NEGATIVE

## 2021-07-17 NOTE — Progress Notes (Signed)
?  Bowel prep reminder:NA ? ?PCP - Dr. Loletha Grayer. webb ?Cardiologist - none ? ?Chest x-ray - no ?EKG - 06/25/21 ?Stress Test - no ?ECHO - 2017 ?Cardiac Cath - na ?Pacemaker/ICD device last checked:NA ? ?Sleep Study - no ?CPAP -  ? ?Fasting Blood Sugar - NA ?Checks Blood Sugar _____ times a day ? ?Blood Thinner Instructions:Eliquis/ Dr. Burt Knack ?Aspirin Instructions:hold 3 days prior to DOS ?Last Dose:4/1 ? ?Anesthesia review: yes ? ?Patient denies shortness of breath, fever, cough and chest pain at PAT appointment ?Pt has no SOB with any activities. She was working out 3 time a week before hip pain ? ?Patient verbalized understanding of instructions that were given to them at the PAT appointment. Patient was also instructed that they will need to review over the PAT instructions again at home before surgery. yes ?

## 2021-07-20 NOTE — Progress Notes (Signed)
Anesthesia Chart Review ? ? Case: [id number] Date/Time: 07/29/21 1115  ? Procedure: TOTAL HIP ARTHROPLASTY ANTERIOR APPROACH (Right: Hip) - 150  ? Anesthesia type: Spinal  ? Pre-op diagnosis: Right hip osteoarthrits  ? Location: [facility] ROOM 07 / [facility]  ? Surgeons: Samson Frederic, MD  ? ?  ? ? ?DISCUSSION:75 y.o. former smoker with h/o PAF on Eliquis, HTN, right hip OA scheduled for above procedure 07/29/2021 with Dr. Samson Frederic.  ? ?Pt seen by cardiology 06/25/2021. Per OV note, "1. The patient is clinically stable.  She will continue metoprolol.  She will continue apixaban for prevention of thromboembolic disease.  She has no history of stroke or TIA.  She has no indication for bridging prior to surgery.  She is at acceptable risk to hold apixaban 48 hours prior to surgery if indicated.  The patient has no high risk features for hip replacement as she can achieve an acceptable workload with no symptoms of angina.  I have not recommended any further cardiac testing.  She has no signs of congestive heart failure, angina, or symptomatic arrhythmia.  She understands that she will be at elevated risk of postoperative atrial fibrillation with her background history of PAF.  She should take her metoprolol the morning of surgery and continue it in the perioperative period. ?2. As above, at low cardiac risk of total hip arthroplasty.  Okay to proceed as outlined." ? ?Per telephone encounter 07/01/21 Dr. Kathline Magic office reached out to cardiology to get clearance to hold Eliquis 72 hours prior to procedure.  ? ?Pt reports to PAT nurse he was advised to hold 72 hours, last dose 07/25/21.  ? ?Anticipate pt can proceed with planned procedure barring acute status change.   ?VS: BP (!) 182/72   Pulse 79   Temp 36.9 ?C (Oral)   Resp 16   Ht 5' 2.5" (1.588 m)   Wt 59 kg   SpO2 100%   BMI 23.40 kg/m?  ? ?PROVIDERS: ?Shirlean Mylar, MD is PCP  ? ?Tonny Bollman, MD is Cardiologist ?LABS: Labs reviewed: Acceptable for surgery. ?(all  labs ordered are listed, but only abnormal results are displayed) ? ?Labs Reviewed  ?SURGICAL PCR SCREEN  ?BASIC METABOLIC PANEL  ?CBC  ?TYPE AND SCREEN  ? ? ? ?IMAGES: ? ? ?EKG: ? ? ?CV: ?Echo 09/05/15 ?Study Conclusions  ? ?- Left ventricle: The cavity size was normal. Systolic function was  ?  normal. The estimated ejection fraction was in the range of 55%  ?  to 60%. Wall motion was normal; there were no regional wall  ?  motion abnormalities. Left ventricular diastolic function  ?  parameters were normal.  ?- Mitral valve: There was mild regurgitation.  ?Past Medical History:  ?Diagnosis Date  ? A-fib ([facility])   ? Aortic atherosclerosis ([facility])   ? Dysrhythmia   ? Hip pain   ? HTN (hypertension)   ? Hypercholesteremia   ? Osteoarthritis   ? PAF (paroxysmal atrial fibrillation) ([facility])   ? ? ?Past Surgical History:  ?Procedure Laterality Date  ? RIGHT OOPHORECTOMY  2006  ? TUBAL LIGATION  1982  ? ? ?MEDICATIONS: ? Calcium Carbonate-Vitamin D 600-3.125 MG-MCG TABS  ? cetirizine (ZYRTEC) 10 MG tablet  ? COLLAGEN-VITAMIN C PO  ? ELIQUIS 5 MG TABS tablet  ? Estradiol 10 MCG TABS vaginal tablet  ? fluticasone (FLONASE) 50 MCG/ACT nasal spray  ? Krill Oil 350 MG CAPS  ? Magnesium 250 MG TABS  ? metoprolol tartrate (LOPRESSOR) 25  MG tablet  ? metroNIDAZOLE (METROGEL) 0.75 % gel  ? Multiple Vitamin (MULTIVITAMIN) tablet  ? Probiotic Product ([employer] DAILY PROBIOTIC PO)  ? Propylene Glycol (SYSTANE COMPLETE) 0.6 % SOLN  ? rosuvastatin (CRESTOR) 10 MG tablet  ? ?No current facility-administered medications for this encounter.  ? ? ?Konrad Felix Ward, PA-C ?[facility] ?[phone] ? ? ? ? ? ?

## 2021-07-20 NOTE — Anesthesia Preprocedure Evaluation (Addendum)
Anesthesia Evaluation  ?Patient identified by MRN, date of birth, ID band ?Patient awake ? ? ? ?Reviewed: ?Allergy & Precautions, H&P , NPO status , Patient's Chart, lab work & pertinent test results, reviewed documented beta blocker date and time  ? ?Airway ?Mallampati: II ? ?TM Distance: >3 FB ?Neck ROM: full ? ? ? Dental ?no notable dental hx. ?(+) Teeth Intact, Caps, Dental Advisory Given ?  ?Pulmonary ?neg pulmonary ROS, former smoker,  ?  ?Pulmonary exam normal ?breath sounds clear to auscultation ? ? ? ? ? ? Cardiovascular ?Exercise Tolerance: Good ?hypertension, Pt. on home beta blockers ?+ dysrhythmias Atrial Fibrillation  ?Rhythm:regular Rate:Normal ? ? ?  ?Neuro/Psych ?negative neurological ROS ? negative psych ROS  ? GI/Hepatic ?negative GI ROS, Neg liver ROS,   ?Endo/Other  ?negative endocrine ROS ? Renal/GU ?negative Renal ROS  ?negative genitourinary ?  ?Musculoskeletal ? ? Abdominal ?  ?Peds ? Hematology ?negative hematology ROS ?(+)   ?Anesthesia Other Findings ? ? Reproductive/Obstetrics ?negative OB ROS ? ?  ? ? ? ? ? ? ? ? ? ? ? ? ? ?  ?  ? ? ? ? ? ? ?Anesthesia Physical ?Anesthesia Plan ? ?ASA: 3 ? ?Anesthesia Plan: MAC and Spinal  ? ?Post-op Pain Management: Minimal or no pain anticipated  ? ?Induction:  ? ?PONV Risk Score and Plan: 2 and Propofol infusion ? ?Airway Management Planned: Natural Airway, Nasal Cannula and Simple Face Mask ? ?Additional Equipment:  ? ?Intra-op Plan:  ? ?Post-operative Plan:  ? ?Informed Consent: I have reviewed the patients History and Physical, chart, labs and discussed the procedure including the risks, benefits and alternatives for the proposed anesthesia with the patient or authorized representative who has indicated his/her understanding and acceptance.  ? ? ? ?Dental Advisory Given ? ?Plan Discussed with: CRNA and Anesthesiologist ? ?Anesthesia Plan Comments: (See PAT note 07/17/2021)  ? ? ? ? ? ?Anesthesia Quick  Evaluation ? ?

## 2021-07-28 ENCOUNTER — Encounter (HOSPITAL_COMMUNITY): Payer: Self-pay | Admitting: Orthopedic Surgery

## 2021-07-29 ENCOUNTER — Encounter (HOSPITAL_COMMUNITY): Payer: Self-pay | Admitting: Orthopedic Surgery

## 2021-07-29 ENCOUNTER — Other Ambulatory Visit: Payer: Self-pay

## 2021-07-29 ENCOUNTER — Ambulatory Visit (HOSPITAL_COMMUNITY): Payer: Federal, State, Local not specified - PPO

## 2021-07-29 ENCOUNTER — Ambulatory Visit (HOSPITAL_COMMUNITY): Payer: Federal, State, Local not specified - PPO | Admitting: Physician Assistant

## 2021-07-29 ENCOUNTER — Encounter (HOSPITAL_COMMUNITY)
Admission: RE | Disposition: A | Discharge status: Home / Self Care | Payer: Self-pay | Source: Ambulatory Visit | Attending: Orthopedic Surgery

## 2021-07-29 ENCOUNTER — Ambulatory Visit (HOSPITAL_COMMUNITY): Payer: Federal, State, Local not specified - PPO | Admitting: Anesthesiology

## 2021-07-29 ENCOUNTER — Observation Stay (HOSPITAL_COMMUNITY)
Admission: RE | Admit: 2021-07-29 | Discharge: 2021-07-30 | Disposition: A | Discharge status: Home / Self Care | Payer: Federal, State, Local not specified - PPO | Source: Ambulatory Visit | Attending: Orthopedic Surgery | Admitting: Orthopedic Surgery

## 2021-07-29 DIAGNOSIS — M1611 Unilateral primary osteoarthritis, right hip: Secondary | ICD-10-CM | POA: Diagnosis not present

## 2021-07-29 DIAGNOSIS — Z87891 Personal history of nicotine dependence: Secondary | ICD-10-CM | POA: Insufficient documentation

## 2021-07-29 DIAGNOSIS — I480 Paroxysmal atrial fibrillation: Secondary | ICD-10-CM | POA: Insufficient documentation

## 2021-07-29 DIAGNOSIS — I10 Essential (primary) hypertension: Secondary | ICD-10-CM | POA: Insufficient documentation

## 2021-07-29 DIAGNOSIS — Z7901 Long term (current) use of anticoagulants: Secondary | ICD-10-CM | POA: Diagnosis not present

## 2021-07-29 DIAGNOSIS — Z79899 Other long term (current) drug therapy: Secondary | ICD-10-CM | POA: Diagnosis not present

## 2021-07-29 HISTORY — PX: TOTAL HIP ARTHROPLASTY: SHX124

## 2021-07-29 LAB — ABO/RH: ABO/RH(D): A POS

## 2021-07-29 SURGERY — ARTHROPLASTY, HIP, TOTAL, ANTERIOR APPROACH
Anesthesia: Monitor Anesthesia Care | Site: Hip | Laterality: Right

## 2021-07-29 MED ORDER — ROSUVASTATIN CALCIUM 10 MG PO TABS
10.0000 mg | ORAL_TABLET | Freq: Every day | ORAL | Status: DC
  Administered 2021-07-29: 10 mg via ORAL
  Filled 2021-07-29: qty 1

## 2021-07-29 MED ORDER — MAGNESIUM 250 MG PO TABS: 250.0000 mg | ORAL_TABLET | Freq: Every day | ORAL | Status: DC

## 2021-07-29 MED ORDER — HYDROCODONE-ACETAMINOPHEN 5-325 MG PO TABS
1.0000 | ORAL_TABLET | ORAL | Status: DC | PRN
  Administered 2021-07-29 – 2021-07-30 (×4): 2 via ORAL
  Filled 2021-07-29 (×4): qty 2

## 2021-07-29 MED ORDER — FENTANYL CITRATE (PF) 100 MCG/2ML IJ SOLN
INTRAMUSCULAR | Status: AC
  Filled 2021-07-29: qty 2

## 2021-07-29 MED ORDER — BUPIVACAINE IN DEXTROSE 0.75-8.25 % IT SOLN
INTRATHECAL | Status: DC | PRN
  Administered 2021-07-29: 1.6 mL via INTRATHECAL

## 2021-07-29 MED ORDER — CHLORHEXIDINE GLUCONATE 0.12 % MT SOLN
15.0000 mL | Freq: Once | OROMUCOSAL | Status: AC
  Administered 2021-07-29: 15 mL via OROMUCOSAL

## 2021-07-29 MED ORDER — DOCUSATE SODIUM 100 MG PO CAPS
100.0000 mg | ORAL_CAPSULE | Freq: Two times a day (BID) | ORAL | Status: DC
  Administered 2021-07-29 – 2021-07-30 (×2): 100 mg via ORAL
  Filled 2021-07-29 (×2): qty 1

## 2021-07-29 MED ORDER — METOCLOPRAMIDE HCL 5 MG/ML IJ SOLN: 5.0000 mg | Freq: Three times a day (TID) | INTRAMUSCULAR | Status: DC | PRN

## 2021-07-29 MED ORDER — WATER FOR IRRIGATION, STERILE IR SOLN
Status: DC | PRN
  Administered 2021-07-29: 1000 mL

## 2021-07-29 MED ORDER — KETOROLAC TROMETHAMINE 30 MG/ML IJ SOLN
INTRAMUSCULAR | Status: DC | PRN
  Administered 2021-07-29: 30 mg via INTRAVENOUS

## 2021-07-29 MED ORDER — DEXMEDETOMIDINE (PRECEDEX) IN NS 20 MCG/5ML (4 MCG/ML) IV SYRINGE
PREFILLED_SYRINGE | INTRAVENOUS | Status: DC | PRN
  Administered 2021-07-29: 8 ug via INTRAVENOUS

## 2021-07-29 MED ORDER — TRANEXAMIC ACID-NACL 1000-0.7 MG/100ML-% IV SOLN
1000.0000 mg | INTRAVENOUS | Status: AC
  Administered 2021-07-29: 1000 mg via INTRAVENOUS
  Filled 2021-07-29: qty 100

## 2021-07-29 MED ORDER — ONDANSETRON HCL 4 MG/2ML IJ SOLN: 4.0000 mg | Freq: Once | INTRAMUSCULAR | Status: DC | PRN

## 2021-07-29 MED ORDER — SODIUM CHLORIDE 0.9 % IV SOLN: INTRAVENOUS | Status: DC

## 2021-07-29 MED ORDER — ALUM & MAG HYDROXIDE-SIMETH 200-200-20 MG/5ML PO SUSP: 30.0000 mL | ORAL | Status: DC | PRN

## 2021-07-29 MED ORDER — SODIUM CHLORIDE (PF) 0.9 % IJ SOLN
INTRAMUSCULAR | Status: AC
  Filled 2021-07-29: qty 50

## 2021-07-29 MED ORDER — BUPIVACAINE-EPINEPHRINE 0.25% -1:200000 IJ SOLN
INTRAMUSCULAR | Status: DC | PRN
  Administered 2021-07-29: 30 mL

## 2021-07-29 MED ORDER — MAGNESIUM OXIDE -MG SUPPLEMENT 400 (240 MG) MG PO TABS
200.0000 mg | ORAL_TABLET | Freq: Every day | ORAL | Status: DC
  Administered 2021-07-30: 200 mg via ORAL
  Filled 2021-07-29: qty 1

## 2021-07-29 MED ORDER — POVIDONE-IODINE 10 % EX SWAB: 2.0000 "application " | Freq: Once | CUTANEOUS | Status: DC

## 2021-07-29 MED ORDER — KETOROLAC TROMETHAMINE 30 MG/ML IJ SOLN
INTRAMUSCULAR | Status: AC
  Filled 2021-07-29: qty 1

## 2021-07-29 MED ORDER — POLYETHYLENE GLYCOL 3350 17 G PO PACK: 17.0000 g | PACK | Freq: Every day | ORAL | Status: DC | PRN

## 2021-07-29 MED ORDER — ACETAMINOPHEN 160 MG/5ML PO SOLN: 325.0000 mg | ORAL | Status: DC | PRN

## 2021-07-29 MED ORDER — FLUTICASONE PROPIONATE 50 MCG/ACT NA SUSP: 1.0000 | NASAL | Status: DC | PRN

## 2021-07-29 MED ORDER — POLYVINYL ALCOHOL 1.4 % OP SOLN: 1.0000 [drp] | OPHTHALMIC | Status: DC | PRN

## 2021-07-29 MED ORDER — COLLAGEN-VITAMIN C 740-125 MG PO CAPS
ORAL_CAPSULE | Freq: Every day | ORAL | Status: DC
Start: 2021-07-29 — End: 2021-07-29

## 2021-07-29 MED ORDER — ONDANSETRON HCL 4 MG/2ML IJ SOLN
INTRAMUSCULAR | Status: DC | PRN
  Administered 2021-07-29: 4 mg via INTRAVENOUS

## 2021-07-29 MED ORDER — ACETAMINOPHEN 500 MG PO TABS: 1000.0000 mg | ORAL_TABLET | Freq: Once | ORAL | Status: DC

## 2021-07-29 MED ORDER — METOPROLOL TARTRATE 25 MG PO TABS
25.0000 mg | ORAL_TABLET | Freq: Two times a day (BID) | ORAL | Status: DC
  Administered 2021-07-29 – 2021-07-30 (×2): 25 mg via ORAL
  Filled 2021-07-29 (×2): qty 1

## 2021-07-29 MED ORDER — SODIUM CHLORIDE (PF) 0.9 % IJ SOLN
INTRAMUSCULAR | Status: DC | PRN
Start: 2021-07-29 — End: 2021-07-29
  Administered 2021-07-29: 30 mL via INTRAVENOUS

## 2021-07-29 MED ORDER — OXYCODONE HCL 5 MG/5ML PO SOLN: 5.0000 mg | Freq: Once | ORAL | Status: DC | PRN

## 2021-07-29 MED ORDER — APIXABAN 2.5 MG PO TABS
2.5000 mg | ORAL_TABLET | Freq: Two times a day (BID) | ORAL | Status: DC
  Administered 2021-07-30: 2.5 mg via ORAL
  Filled 2021-07-29: qty 1

## 2021-07-29 MED ORDER — CELECOXIB 200 MG PO CAPS
200.0000 mg | ORAL_CAPSULE | Freq: Once | ORAL | Status: AC
  Administered 2021-07-29: 200 mg via ORAL
  Filled 2021-07-29: qty 1

## 2021-07-29 MED ORDER — FENTANYL CITRATE (PF) 100 MCG/2ML IJ SOLN
INTRAMUSCULAR | Status: DC | PRN
Start: 2021-07-29 — End: 2021-07-29
  Administered 2021-07-29 (×2): 50 ug via INTRAVENOUS

## 2021-07-29 MED ORDER — DEXAMETHASONE SODIUM PHOSPHATE 10 MG/ML IJ SOLN
INTRAMUSCULAR | Status: DC | PRN
  Administered 2021-07-29: 10 mg via INTRAVENOUS

## 2021-07-29 MED ORDER — POVIDONE-IODINE 10 % EX SWAB
2.0000 "application " | Freq: Once | CUTANEOUS | Status: AC
  Administered 2021-07-29: 2 via TOPICAL

## 2021-07-29 MED ORDER — METHOCARBAMOL 500 MG IVPB - SIMPLE MED
500.0000 mg | Freq: Four times a day (QID) | INTRAVENOUS | Status: DC | PRN
  Filled 2021-07-29: qty 50

## 2021-07-29 MED ORDER — BUPIVACAINE-EPINEPHRINE (PF) 0.25% -1:200000 IJ SOLN
INTRAMUSCULAR | Status: AC
  Filled 2021-07-29: qty 30

## 2021-07-29 MED ORDER — FENTANYL CITRATE PF 50 MCG/ML IJ SOSY: 25.0000 ug | PREFILLED_SYRINGE | INTRAMUSCULAR | Status: DC | PRN

## 2021-07-29 MED ORDER — CEFAZOLIN SODIUM-DEXTROSE 2-4 GM/100ML-% IV SOLN
2.0000 g | Freq: Four times a day (QID) | INTRAVENOUS | Status: AC
  Administered 2021-07-29 – 2021-07-30 (×2): 2 g via INTRAVENOUS
  Filled 2021-07-29 (×2): qty 100

## 2021-07-29 MED ORDER — MENTHOL 3 MG MT LOZG: 1.0000 | LOZENGE | OROMUCOSAL | Status: DC | PRN

## 2021-07-29 MED ORDER — ACETAMINOPHEN 325 MG PO TABS: 325.0000 mg | ORAL_TABLET | Freq: Four times a day (QID) | ORAL | Status: DC | PRN

## 2021-07-29 MED ORDER — ISOPROPYL ALCOHOL 70 % SOLN
Status: DC | PRN
  Administered 2021-07-29: 1 via TOPICAL

## 2021-07-29 MED ORDER — MEPERIDINE HCL 50 MG/ML IJ SOLN: 6.2500 mg | INTRAMUSCULAR | Status: DC | PRN

## 2021-07-29 MED ORDER — SENNA 8.6 MG PO TABS
1.0000 | ORAL_TABLET | Freq: Two times a day (BID) | ORAL | Status: DC
  Administered 2021-07-29 – 2021-07-30 (×2): 8.6 mg via ORAL
  Filled 2021-07-29 (×2): qty 1

## 2021-07-29 MED ORDER — PROPOFOL 500 MG/50ML IV EMUL
INTRAVENOUS | Status: AC
  Filled 2021-07-29: qty 50

## 2021-07-29 MED ORDER — SODIUM CHLORIDE 0.9 % IR SOLN
Status: DC | PRN
  Administered 2021-07-29: 3000 mL

## 2021-07-29 MED ORDER — MORPHINE SULFATE (PF) 2 MG/ML IV SOLN: 0.5000 mg | INTRAVENOUS | Status: DC | PRN

## 2021-07-29 MED ORDER — LACTATED RINGERS IV SOLN: INTRAVENOUS | Status: DC

## 2021-07-29 MED ORDER — LORATADINE 10 MG PO TABS
10.0000 mg | ORAL_TABLET | Freq: Every day | ORAL | Status: DC
Start: 2021-07-30 — End: 2021-07-30
  Administered 2021-07-30: 10 mg via ORAL
  Filled 2021-07-29: qty 1

## 2021-07-29 MED ORDER — PROPOFOL 500 MG/50ML IV EMUL
INTRAVENOUS | Status: DC | PRN
  Administered 2021-07-29: 30 mg via INTRAVENOUS
  Administered 2021-07-29: 75 ug/kg/min via INTRAVENOUS

## 2021-07-29 MED ORDER — OYSTER SHELL CALCIUM/D3 500-5 MG-MCG PO TABS
1.0000 | ORAL_TABLET | Freq: Every day | ORAL | Status: DC
  Administered 2021-07-30: 1 via ORAL
  Filled 2021-07-29: qty 1

## 2021-07-29 MED ORDER — DIPHENHYDRAMINE HCL 12.5 MG/5ML PO ELIX: 12.5000 mg | ORAL_SOLUTION | ORAL | Status: DC | PRN

## 2021-07-29 MED ORDER — PHENOL 1.4 % MT LIQD: 1.0000 | OROMUCOSAL | Status: DC | PRN

## 2021-07-29 MED ORDER — CEFAZOLIN SODIUM-DEXTROSE 2-4 GM/100ML-% IV SOLN
2.0000 g | INTRAVENOUS | Status: AC
  Administered 2021-07-29: 2 g via INTRAVENOUS
  Filled 2021-07-29: qty 100

## 2021-07-29 MED ORDER — ORAL CARE MOUTH RINSE: 15.0000 mL | Freq: Once | OROMUCOSAL | Status: AC

## 2021-07-29 MED ORDER — PROPYLENE GLYCOL 0.6 % OP SOLN: 1.0000 [drp] | Freq: Two times a day (BID) | OPHTHALMIC | Status: DC | PRN

## 2021-07-29 MED ORDER — ACETAMINOPHEN 325 MG PO TABS: 325.0000 mg | ORAL_TABLET | ORAL | Status: DC | PRN

## 2021-07-29 MED ORDER — ONDANSETRON HCL 4 MG PO TABS: 4.0000 mg | ORAL_TABLET | Freq: Four times a day (QID) | ORAL | Status: DC | PRN

## 2021-07-29 MED ORDER — METOCLOPRAMIDE HCL 5 MG PO TABS: 5.0000 mg | ORAL_TABLET | Freq: Three times a day (TID) | ORAL | Status: DC | PRN

## 2021-07-29 MED ORDER — HYDROCODONE-ACETAMINOPHEN 7.5-325 MG PO TABS: 1.0000 | ORAL_TABLET | ORAL | Status: DC | PRN

## 2021-07-29 MED ORDER — ONDANSETRON HCL 4 MG/2ML IJ SOLN: 4.0000 mg | Freq: Four times a day (QID) | INTRAMUSCULAR | Status: DC | PRN

## 2021-07-29 MED ORDER — CELECOXIB 200 MG PO CAPS
200.0000 mg | ORAL_CAPSULE | Freq: Two times a day (BID) | ORAL | Status: DC
  Administered 2021-07-29 – 2021-07-30 (×2): 200 mg via ORAL
  Filled 2021-07-29 (×2): qty 1

## 2021-07-29 MED ORDER — CALCIUM CARBONATE-VITAMIN D 600-3.125 MG-MCG PO TABS: 1.0000 | ORAL_TABLET | Freq: Every day | ORAL | Status: DC

## 2021-07-29 MED ORDER — ACETAMINOPHEN 500 MG PO TABS
1000.0000 mg | ORAL_TABLET | Freq: Once | ORAL | Status: AC
  Administered 2021-07-29: 1000 mg via ORAL
  Filled 2021-07-29: qty 2

## 2021-07-29 MED ORDER — OXYCODONE HCL 5 MG PO TABS: 5.0000 mg | ORAL_TABLET | Freq: Once | ORAL | Status: DC | PRN

## 2021-07-29 MED ORDER — METHOCARBAMOL 500 MG PO TABS
500.0000 mg | ORAL_TABLET | Freq: Four times a day (QID) | ORAL | Status: DC | PRN
  Administered 2021-07-29 – 2021-07-30 (×2): 500 mg via ORAL
  Filled 2021-07-29 (×2): qty 1

## 2021-07-29 SURGICAL SUPPLY — 56 items
BAG COUNTER SPONGE SURGICOUNT (BAG) ×1 IMPLANT
BAG DECANTER FOR FLEXI CONT (MISCELLANEOUS) IMPLANT
BAG ZIPLOCK 12X15 (MISCELLANEOUS) IMPLANT
CHLORAPREP W/TINT 26 (MISCELLANEOUS) ×2 IMPLANT
COVER PERINEAL POST (MISCELLANEOUS) ×2 IMPLANT
COVER SURGICAL LIGHT HANDLE (MISCELLANEOUS) ×2 IMPLANT
DERMABOND ADVANCED (GAUZE/BANDAGES/DRESSINGS) ×1
DERMABOND ADVANCED .7 DNX12 (GAUZE/BANDAGES/DRESSINGS) ×2 IMPLANT
DRAPE IMP U-DRAPE 54X76 (DRAPES) ×2 IMPLANT
DRAPE SHEET LG 3/4 BI-LAMINATE (DRAPES) ×6 IMPLANT
DRAPE STERI IOBAN 125X83 (DRAPES) ×2 IMPLANT
DRAPE U-SHAPE 47X51 STRL (DRAPES) ×4 IMPLANT
DRSG AQUACEL AG ADV 3.5X10 (GAUZE/BANDAGES/DRESSINGS) ×2 IMPLANT
ELECT REM PT RETURN 15FT ADLT (MISCELLANEOUS) ×2 IMPLANT
EVACUATOR DRAINAGE 10X20 100CC (DRAIN) IMPLANT
EVACUATOR SILICONE 100CC (DRAIN) ×2
GAUZE SPONGE 4X4 12PLY STRL (GAUZE/BANDAGES/DRESSINGS) ×2 IMPLANT
GLOVE SURG ENC MOIS LTX SZ8.5 (GLOVE) ×4 IMPLANT
GLOVE SURG UNDER POLY LF SZ8.5 (GLOVE) ×2 IMPLANT
GOWN SPEC L3 XXLG W/TWL (GOWN DISPOSABLE) ×4 IMPLANT
HANDPIECE INTERPULSE COAX TIP (DISPOSABLE) ×2
HD FEM MOD 36M STD (Miscellaneous) ×2 IMPLANT
HEAD FEM MOD 36M STD (Miscellaneous) IMPLANT
HOLDER FOLEY CATH W/STRAP (MISCELLANEOUS) ×2 IMPLANT
HOOD PEEL AWAY FLYTE STAYCOOL (MISCELLANEOUS) ×6 IMPLANT
KIT TURNOVER KIT A (KITS) ×1 IMPLANT
LINER ACETAB ~~LOC~~ D 36 (Liner) ×1 IMPLANT
MANIFOLD NEPTUNE II (INSTRUMENTS) ×2 IMPLANT
MARKER SKIN DUAL TIP RULER LAB (MISCELLANEOUS) ×2 IMPLANT
NDL SAFETY ECLIPSE 18X1.5 (NEEDLE) ×1 IMPLANT
NDL SPNL 18GX3.5 QUINCKE PK (NEEDLE) ×1 IMPLANT
NEEDLE HYPO 18GX1.5 SHARP (NEEDLE) ×2
NEEDLE SPNL 18GX3.5 QUINCKE PK (NEEDLE) ×2 IMPLANT
PACK ANTERIOR HIP CUSTOM (KITS) ×2 IMPLANT
PENCIL SMOKE EVACUATOR (MISCELLANEOUS) ×1 IMPLANT
SAW OSC TIP CART 19.5X105X1.3 (SAW) ×2 IMPLANT
SEALER BIPOLAR AQUA 6.0 (INSTRUMENTS) ×2 IMPLANT
SET HNDPC FAN SPRY TIP SCT (DISPOSABLE) ×1 IMPLANT
SHELL ACET G7 3H 50 SZD (Shell) ×1 IMPLANT
SOLUTION PRONTOSAN WOUND 350ML (IRRIGATION / IRRIGATOR) ×2 IMPLANT
SPIKE FLUID TRANSFER (MISCELLANEOUS) ×2 IMPLANT
SPONGE T-LAP 18X18 ~~LOC~~+RFID (SPONGE) ×6 IMPLANT
STAPLER INSORB 30 2030 C-SECTI (MISCELLANEOUS) IMPLANT
STEM FEMORAL TAPERLOC 13X111 (Stem) ×1 IMPLANT
SUT ETHILON 3 0 PS 1 (SUTURE) ×1 IMPLANT
SUT MNCRL AB 3-0 PS2 18 (SUTURE) ×2 IMPLANT
SUT MON AB 2-0 CT1 36 (SUTURE) ×2 IMPLANT
SUT STRATAFIX PDO 1 14 VIOLET (SUTURE) ×2
SUT STRATFX PDO 1 14 VIOLET (SUTURE) ×1
SUT VIC AB 2-0 CT1 27 (SUTURE)
SUT VIC AB 2-0 CT1 TAPERPNT 27 (SUTURE) IMPLANT
SUTURE STRATFX PDO 1 14 VIOLET (SUTURE) ×1 IMPLANT
SYR 3ML LL SCALE MARK (SYRINGE) ×2 IMPLANT
TRAY FOLEY MTR SLVR 16FR STAT (SET/KITS/TRAYS/PACK) ×1 IMPLANT
TUBE SUCTION HIGH CAP CLEAR NV (SUCTIONS) ×2 IMPLANT
WATER STERILE IRR 1000ML POUR (IV SOLUTION) ×2 IMPLANT

## 2021-07-29 NOTE — Interval H&P Note (Signed)
History and Physical Interval Note: ? ?07/29/2021 ?2:17 PM ? ?Katrina Oliver  has presented today for surgery, with the diagnosis of Right hip osteoarthrits.  The various methods of treatment have been discussed with the patient and family. After consideration of risks, benefits and other options for treatment, the patient has consented to  Procedure(s) with comments: ?TOTAL HIP ARTHROPLASTY ANTERIOR APPROACH (Right) - 150 as a surgical intervention.  The patient's history has been reviewed, patient examined, no change in status, stable for surgery.  I have reviewed the patient's chart and labs.  Questions were answered to the patient's satisfaction.   ? ? ?[name] ? ? ?

## 2021-07-29 NOTE — Anesthesia Procedure Notes (Signed)
Spinal ? ?Patient location during procedure: OR ?Start time: 07/29/2021 3:48 PM ?End time: 07/29/2021 3:51 PM ?Reason for block: surgical anesthesia ?Staffing ?Anesthesiologist: Bethena Midget, MD ?Preanesthetic Checklist ?Completed: patient identified, IV checked, site marked, risks and benefits discussed, surgical consent, monitors and equipment checked, pre-op evaluation and timeout performed ?Spinal Block ?Patient position: sitting ?Prep: DuraPrep ?Patient monitoring: heart rate, cardiac monitor, continuous pulse ox and blood pressure ?Approach: midline ?Location: L4-5 ?Injection technique: single-shot ?Needle ?Needle type: Sprotte  ?Needle gauge: 24 G ?Needle length: 9 cm ?Assessment ?Sensory level: T4 ?Events: CSF return ? ? ? ?

## 2021-07-29 NOTE — Progress Notes (Signed)
PHARMACIST - PHYSICIAN ORDER COMMUNICATION ? ?CONCERNING: P&T Medication Policy on Herbal Medications ? ?DESCRIPTION:  This patient?s order for:  Collagen-Vit C  has been noted. ? ?This product(s) is classified as an ?herbal? or natural product. ?Due to a lack of definitive safety studies or FDA approval, nonstandard manufacturing practices, plus the potential risk of unknown drug-drug interactions while on inpatient medications, the Pharmacy and Therapeutics Committee does not permit the use of ?herbal? or natural products of this type within [facility]. ?  ?ACTION TAKEN: ?The pharmacy department is unable to verify this order at this time and your patient has been informed of this safety policy. ?Please reevaluate patient?s clinical condition at discharge and address if the herbal or natural product(s) should be resumed at that time. ? ?Loralee Pacas, PharmD, BCPS ?Pharmacy: [phone] ?07/29/2021 8:40 PM ? ?

## 2021-07-29 NOTE — Op Note (Signed)
OPERATIVE REPORT ? ?SURGEON: Rod Can, MD  ? ?ASSISTANT: Nehemiah Massed, PA-C ?Larene Pickett, PA-C ? ?PREOPERATIVE DIAGNOSIS: Right hip arthritis.  ? ?POSTOPERATIVE DIAGNOSIS: Right hip arthritis.  ? ?PROCEDURE: Right total hip arthroplasty, anterior approach.  ? ?IMPLANTS: Biomet Taperloc Complete Microplasty stem, size 13 x 111, high offset. ?Biomet G7 OsseoTi Cup, size 50 mm. ?Biomet Vivacit-E liner, size 36 mm, D, neutral. ?Biomet metal head ball, size 36 + 0 mm. ? ?ANESTHESIA:  MAC, Regional, and Spinal ? ?ESTIMATED BLOOD LOSS:-200 mL   ? ?ANTIBIOTICS: 2 g Ancef. ? ?DRAINS: 10 mm flat JP drain in subcutaneous tissue. ? ?COMPLICATIONS: None. ?  ?CONDITION: PACU - hemodynamically stable.  ? ?BRIEF CLINICAL NOTE: Katrina Oliver is a 76 y.o. female with a long-standing history of Right hip arthritis. After failing conservative management, the patient was indicated for total hip arthroplasty. The risks, benefits, and alternatives to the procedure were explained, and the patient elected to proceed. ? ?PROCEDURE IN DETAIL: Surgical site was marked by myself in the pre-op holding area. Once inside the operating room, spinal anesthesia was obtained, and a foley catheter was inserted. The patient was then positioned on the Hana table.  All bony prominences were well padded.  The hip was prepped and draped in the normal sterile surgical fashion.  A time-out was called verifying side and site of surgery. The patient received IV antibiotics within 60 minutes of beginning the procedure. ?  ?Bikini incision was made, and superficial dissection was performed lateral to the ASIS. The direct anterior approach to the hip was performed through the Hueter interval.  Lateral femoral circumflex vessels were treated with the Auqumantys. The anterior capsule was exposed and an inverted T capsulotomy was made. The femoral neck cut was made to the level of the templated cut.  A corkscrew was placed into the head and the  head was removed.  The femoral head was found to have eburnated bone. The head was passed to the back table and was measured. Pubofemoral ligament was released off of the calcar, taking care to stay on bone. Superior capsule was released from the greater trochanter, taking care to stay lateral to the posterior border of the femoral neck in order to preserve the short external rotators. ?  ?Acetabular exposure was achieved, and the pulvinar and labrum were excised. Sequential reaming of the acetabulum was then performed up to a size 49 mm reamer. A 50 mm cup was then opened and impacted into place at approximately 40 degrees of abduction and 20 degrees of anteversion. The final polyethylene liner was impacted into place and acetabular osteophytes were removed.  ?  ?I then gained femoral exposure taking care to protect the abductors and greater trochanter.  This was performed using standard external rotation, extension, and adduction.  A cookie cutter was used to enter the femoral canal, and then the femoral canal finder was placed.  Sequential broaching was performed up to a size 13.  Calcar planer was used on the femoral neck remnant.  I placed a high offset neck and a trial head ball.  The hip was reduced.  Leg lengths and offset were checked fluoroscopically.  The hip was dislocated and trial components were removed.  The final implants were placed, and the hip was reduced.  Fluoroscopy was used to confirm component position and leg lengths.  At 90 degrees of external rotation and full extension, the hip was stable to an anterior directed force. ?  ?The wound was copiously irrigated  with Prontosan solution and normal saline using pule lavage.  Marcaine solution was injected into the periarticular soft tissue.  Through a separate stab incision, a 10 mm flat JP drain was placed into the subcutaneous tissue. The drain was sewn in with a 3-0 nylon suture. The wound was closed in layers using #1 Vicryl and V-Loc for  the fascia, 2-0 Vicryl for the subcutaneous fat, 2-0 Monocryl for the deep dermal layer, 3-0 running Monocryl subcuticular stitch, and Dermabond for the skin.  Once the glue was fully dried, an Aquacell Ag dressing was applied.  The patient was transported to the recovery room in stable condition.  Sponge, needle, and instrument counts were correct at the end of the case x2.  The patient tolerated the procedure well and there were no known complications. ? ?Please note that a surgical assistant was a medical necessity for this procedure to perform it in a safe and expeditious manner. Assistant was necessary to provide appropriate retraction of vital neurovascular structures, to prevent femoral fracture, and to allow for anatomic placement of the prosthesis. ?

## 2021-07-29 NOTE — Anesthesia Postprocedure Evaluation (Signed)
Anesthesia Post Note ? ?Patient: SHAY JHAVERI ? ?Procedure(s) Performed: TOTAL HIP ARTHROPLASTY ANTERIOR APPROACH (Right: Hip) ? ?  ? ?Patient location during evaluation: PACU ?Anesthesia Type: MAC ?Level of consciousness: awake and alert ?Pain management: pain level controlled ?Vital Signs Assessment: post-procedure vital signs reviewed and stable ?Respiratory status: spontaneous breathing and respiratory function stable ?Cardiovascular status: blood pressure returned to baseline and stable ?Postop Assessment: spinal receding ?Anesthetic complications: no ? ? ?No notable events documented. ? ?Last Vitals:  ?Vitals:  ? 07/29/21 1845 07/29/21 1900  ?BP: (!) 146/54 (!) 138/45  ?Pulse: 63 (!) 57  ?Resp: 14 16  ?Temp:  36.4 ?C  ?SpO2: 96% 96%  ?  ?Last Pain:  ?Vitals:  ? 07/29/21 1900  ?TempSrc:   ?PainSc: 0-No pain  ? ? ?  ?  ?  ?  ?  ?  ? ?Jadzia Ibsen DANIEL ? ? ? ? ?

## 2021-07-29 NOTE — Discharge Instructions (Addendum)
? ?Dr. Brian Swinteck ?Joint Replacement Specialist ?[facility] ?[address] ?[place], [place] [address] ?[phone] ? ? ?TOTAL HIP REPLACEMENT POSTOPERATIVE DIRECTIONS ? ? ? ?Hip Rehabilitation, Guidelines Following Surgery  ? ?WEIGHT BEARING ?Weight bearing as tolerated with assist device (walker, cane, etc) as directed, use it as long as suggested by your surgeon or therapist, typically at least 4-6 weeks. ? ?The results of a hip operation are greatly improved after range of motion and muscle strengthening exercises. Follow all safety measures which are given to protect your hip. If any of these exercises cause increased pain or swelling in your joint, decrease the amount until you are comfortable again. Then slowly increase the exercises. Call your caregiver if you have problems or questions.  ? ?HOME CARE INSTRUCTIONS  ?Most of the following instructions are designed to prevent the dislocation of your new hip.  ?Remove items at home which could result in a fall. This includes throw rugs or furniture in walking pathways.  ?Continue medications as instructed at time of discharge. ?You may have some home medications which will be placed on hold until you complete the course of blood thinner medication. ?You may start showering once you are discharged home. Do not remove your dressing. ?Do not put on socks or shoes without following the instructions of your caregivers.   ?Sit on chairs with arms. Use the chair arms to help push yourself up when arising.  ?Arrange for the use of a toilet seat elevator so you are not sitting low.  ?Walk with walker as instructed.  ?You may resume a sexual relationship in one month or when given the OK by your caregiver.  ?Use walker as long as suggested by your caregivers.  ?You may put full weight on your legs and walk as much as is comfortable. ?Avoid periods of inactivity such as sitting longer than an hour when not asleep. This helps prevent blood  clots.  ?You may return to work once you are cleared by your surgeon.  ?Do not drive a car for 6 weeks or until released by your surgeon.  ?Do not drive while taking narcotics.  ?Wear elastic stockings for two weeks following surgery during the day but you may remove then at night.  ?Make sure you keep all of your appointments after your operation with all of your doctors and caregivers. You should call the office at the above phone number and make an appointment for approximately two weeks after the date of your surgery. ?Please pick up a stool softener and laxative for home use as long as you are requiring pain medications. ?ICE to the affected hip every three hours for 30 minutes at a time and then as needed for pain and swelling. Continue to use ice on the hip for pain and swelling from surgery. You may notice swelling that will progress down to the foot and ankle.  This is normal after surgery.  Elevate the leg when you are not up walking on it.   ?It is important for you to complete the blood thinner medication as prescribed by your doctor. ?Continue to use the breathing machine which will help keep your temperature down.  It is common for your temperature to cycle up and down following surgery, especially at night when you are not up moving around and exerting yourself.  The breathing machine keeps your lungs expanded and your temperature down. ? ?RANGE OF MOTION AND STRENGTHENING EXERCISES  ?These exercises are designed to help you   keep full movement of your hip joint. Follow your caregiver's or physical therapist's instructions. Perform all exercises about fifteen times, three times per day or as directed. Exercise both hips, even if you have had only one joint replacement. These exercises can be done on a training (exercise) mat, on the floor, on a table or on a bed. Use whatever works the best and is most comfortable for you. Use music or television while you are exercising so that the exercises are a  pleasant break in your day. This will make your life better with the exercises acting as a break in routine you can look forward to.  ?Lying on your back, slowly slide your foot toward your buttocks, raising your knee up off the floor. Then slowly slide your foot back down until your leg is straight again.  ?Lying on your back spread your legs as far apart as you can without causing discomfort.  ?Lying on your side, raise your upper leg and foot straight up from the floor as far as is comfortable. Slowly lower the leg and repeat.  ?Lying on your back, tighten up the muscle in the front of your thigh (quadriceps muscles). You can do this by keeping your leg straight and trying to raise your heel off the floor. This helps strengthen the largest muscle supporting your knee.  ?Lying on your back, tighten up the muscles of your buttocks both with the legs straight and with the knee bent at a comfortable angle while keeping your heel on the floor.  ? ?SKILLED REHAB INSTRUCTIONS: ?If the patient is transferred to a skilled rehab facility following release from the hospital, a list of the current medications will be sent to the facility for the patient to continue.  When discharged from the skilled rehab facility, please have the facility set up the patient's Home Health Physical Therapy prior to being released. Also, the skilled facility will be responsible for providing the patient with their medications at time of release from the facility to include their pain medication and their blood thinner medication. If the patient is still at the rehab facility at time of the two week follow up appointment, the skilled rehab facility will also need to assist the patient in arranging follow up appointment in our office and any transportation needs. ? ?POST-OPERATIVE OPIOID TAPER INSTRUCTIONS: ?It is important to wean off of your opioid medication as soon as possible. If you do not need pain medication after your surgery it is ok  to stop day one. ?Opioids include: ?Codeine, Hydrocodone(Norco, Vicodin), Oxycodone(Percocet, oxycontin) and hydromorphone amongst others.  ?Long term and even short term use of opiods can cause: ?Increased pain response ?Dependence ?Constipation ?Depression ?Respiratory depression ?And more.  ?Withdrawal symptoms can include ?Flu like symptoms ?Nausea, vomiting ?And more ?Techniques to manage these symptoms ?Hydrate well ?Eat regular healthy meals ?Stay active ?Use relaxation techniques(deep breathing, meditating, yoga) ?Do Not substitute Alcohol to help with tapering ?If you have been on opioids for less than two weeks and do not have pain than it is ok to stop all together.  ?Plan to wean off of opioids ?This plan should start within one week post op of your joint replacement. ?Maintain the same interval or time between taking each dose and first decrease the dose.  ?Cut the total daily intake of opioids by one tablet each day ?Next start to increase the time between doses. ?The last dose that should be eliminated is the evening dose.  ? ? ?MAKE   SURE YOU:  ?Understand these instructions.  ?Will watch your condition.  ?Will get help right away if you are not doing well or get worse. ? ?Pick up stool softner and laxative for home use following surgery while on pain medications. ?Do not remove your dressing. ?The dressing is waterproof. Leave aquacel dressing on knee until follow-up visit.  ?Continue to use ice for pain and swelling after surgery. ?Do not use any lotions or creams on the incision until instructed by your surgeon. ?Do not shower with the JP drain. ?Every 8 hours:  ?1. empty the drain  ?2. Record amount in the drain  ?3. Recharge the drain  ?4. Keep a log of the amount emptied each time.  ?Please record amount of fluid from the drain on a daily log and bring with you to your next appointment. ?Total Hip Protocol.  ?

## 2021-07-29 NOTE — Transfer of Care (Signed)
Immediate Anesthesia Transfer of Care Note ? ?Patient: Katrina Oliver ? ?Procedure(s) Performed: TOTAL HIP ARTHROPLASTY ANTERIOR APPROACH (Right: Hip) ? ?Patient Location: PACU ? ?Anesthesia Type:Spinal ? ?Level of Consciousness: awake, alert  and patient cooperative ? ?Airway & Oxygen Therapy: Patient Spontanous Breathing and Patient connected to face mask oxygen ? ?Post-op Assessment: Report given to RN and Post -op Vital signs reviewed and stable ? ?Post vital signs: Reviewed and stable ? ?Last Vitals:  ?Vitals Value Taken Time  ?BP    ?Temp    ?Pulse 69 07/29/21 1818  ?Resp 16 07/29/21 1818  ?SpO2 98 % 07/29/21 1818  ?Vitals shown include unvalidated device data. ? ?Last Pain:  ?Vitals:  ? 07/29/21 1302  ?TempSrc: Oral  ?PainSc:   ?   ? ?Patients Stated Pain Goal: 3 (07/29/21 1300) ? ?Complications: No notable events documented. ?

## 2021-07-30 DIAGNOSIS — M1611 Unilateral primary osteoarthritis, right hip: Secondary | ICD-10-CM | POA: Diagnosis not present

## 2021-07-30 LAB — CBC
HCT: 27.6 % — ABNORMAL LOW (ref 36.0–46.0)
Hemoglobin: 8.5 g/dL — ABNORMAL LOW (ref 12.0–15.0)
MCH: 31.8 pg (ref 26.0–34.0)
MCHC: 30.8 g/dL (ref 30.0–36.0)
MCV: 103.4 fL — ABNORMAL HIGH (ref 80.0–100.0)
Platelets: 140 10*3/uL — ABNORMAL LOW (ref 150–400)
RBC: 2.67 MIL/uL — ABNORMAL LOW (ref 3.87–5.11)
RDW: 12.5 % (ref 11.5–15.5)
WBC: 6 10*3/uL (ref 4.0–10.5)
nRBC: 0 % (ref 0.0–0.2)

## 2021-07-30 LAB — BASIC METABOLIC PANEL
Anion gap: 3 — ABNORMAL LOW (ref 5–15)
Anion gap: 10 (ref 5–15)
BUN: 9 mg/dL (ref 8–23)
BUN: 15 mg/dL (ref 8–23)
CO2: 14 mmol/L — ABNORMAL LOW (ref 22–32)
CO2: 24 mmol/L (ref 22–32)
Calcium: 4.3 mg/dL — CL (ref 8.9–10.3)
Calcium: 8.7 mg/dL — ABNORMAL LOW (ref 8.9–10.3)
Chloride: 126 mmol/L — ABNORMAL HIGH (ref 98–111)
Chloride: 103 mmol/L (ref 98–111)
Creatinine, Ser: 0.32 mg/dL — ABNORMAL LOW (ref 0.44–1.00)
Creatinine, Ser: 0.7 mg/dL (ref 0.44–1.00)
GFR, Estimated: >60 mL/min (ref >60)
GFR, Estimated: >60 mL/min (ref >60)
Glucose, Bld: 117 mg/dL — ABNORMAL HIGH (ref 70–99)
Glucose, Bld: 160 mg/dL — ABNORMAL HIGH (ref 70–99)
Potassium: 2.2 mmol/L — CL (ref 3.5–5.1)
Potassium: 4.2 mmol/L (ref 3.5–5.1)
Sodium: 143 mmol/L (ref 135–145)
Sodium: 137 mmol/L (ref 135–145)

## 2021-07-30 MED ORDER — DOCUSATE SODIUM 100 MG PO CAPS: 100.0000 mg | ORAL_CAPSULE | Freq: Two times a day (BID) | ORAL | 1 refills | Status: AC

## 2021-07-30 MED ORDER — POLYETHYLENE GLYCOL 3350 17 G PO PACK: 17.0000 g | PACK | Freq: Every day | ORAL | 0 refills | Status: DC | PRN

## 2021-07-30 MED ORDER — ONDANSETRON HCL 4 MG PO TABS: 4.0000 mg | ORAL_TABLET | Freq: Four times a day (QID) | ORAL | 0 refills | Status: DC | PRN

## 2021-07-30 MED ORDER — HYDROCODONE-ACETAMINOPHEN 5-325 MG PO TABS: 1.0000 | ORAL_TABLET | ORAL | 0 refills | Status: DC | PRN

## 2021-07-30 MED ORDER — POTASSIUM CHLORIDE 10 MEQ/100ML IV SOLN
10.0000 meq | Freq: Once | INTRAVENOUS | Status: AC
  Administered 2021-07-30: 10 meq via INTRAVENOUS
  Filled 2021-07-30: qty 100

## 2021-07-30 MED ORDER — SENNA 8.6 MG PO TABS: 2.0000 | ORAL_TABLET | Freq: Every day | ORAL | 1 refills | Status: AC

## 2021-07-30 MED ORDER — CALCIUM GLUCONATE-NACL 1-0.675 GM/50ML-% IV SOLN
1.0000 g | Freq: Once | INTRAVENOUS | Status: AC
  Administered 2021-07-30: 1000 mg via INTRAVENOUS
  Filled 2021-07-30: qty 50

## 2021-07-30 NOTE — Discharge Summary (Signed)
Physician Discharge Summary  ?Patient ID: ?Katrina Oliver ?MRN: [id number] ?DOB/AGE: 78-Jan-1947 78 y.o. ? ?Admit date: 07/29/2021 ?Discharge date: 07/30/2021 ? ?Admission Diagnoses:  ?Osteoarthritis of right hip ? ?Discharge Diagnoses:  ?Principal Problem: ?  Osteoarthritis of right hip ?Active Problems: ?  Primary osteoarthritis of right hip ? ? ?Past Medical History:  ?Diagnosis Date  ? A-fib ([place])   ? Aortic atherosclerosis ([place])   ? Dysrhythmia   ? Hip pain   ? HTN (hypertension)   ? Hypercholesteremia   ? Osteoarthritis   ? PAF (paroxysmal atrial fibrillation) ([place])   ? ? ?Surgeries: Procedure(s): ?TOTAL HIP ARTHROPLASTY ANTERIOR APPROACH on 07/29/2021 ?  ?Consultants (if any):  ? ?Discharged Condition: Improved ? ?Hospital Course: Katrina Oliver is an 76 y.o. female who was admitted 07/29/2021 with a diagnosis of Osteoarthritis of right hip and went to the operating room on 07/29/2021 and underwent the above named procedures.   ? ?She was given perioperative antibiotics:  ?Anti-infectives (From admission, onward)  ? ? Start     Dose/Rate Route Frequency Ordered Stop  ? 07/30/21 0600  ceFAZolin (ANCEF) IVPB 2g/100 mL premix       ? 2 g ?200 mL/hr over 30 Minutes Intravenous On call to O.R. 07/29/21 1237 07/29/21 1613  ? 07/29/21 2200  ceFAZolin (ANCEF) IVPB 2g/100 mL premix       ? 2 g ?200 mL/hr over 30 Minutes Intravenous Every 6 hours 07/29/21 2032 07/30/21 0350  ? ?  ? ? ?She was given sequential compression devices, early ambulation, and Eliquis for DVT prophylaxis. ? ?She benefited maximally from the hospital stay and there were no complications.   ? ?Recent vital signs:  ?Vitals:  ? 07/30/21 1005 07/30/21 1312  ?BP: (!) 142/55 (!) 155/51  ?Pulse: 67 61  ?Resp: 16 16  ?Temp: 97.8 ?F (36.6 ?C) 97.8 ?F (36.6 ?C)  ?SpO2: 100% 96%  ? ? ?Recent laboratory studies:  ?Lab Results  ?Component Value Date  ? HGB 8.5 (L) 07/30/2021  ? HGB 14.3 07/17/2021  ? HGB 14.4 04/09/2021  ? ?Lab Results  ?Component Value Date   ? WBC 6.0 07/30/2021  ? PLT 140 (L) 07/30/2021  ? ?No results found for: INR ?Lab Results  ?Component Value Date  ? NA 137 07/30/2021  ? K 4.2 07/30/2021  ? CL 103 07/30/2021  ? CO2 24 07/30/2021  ? BUN 15 07/30/2021  ? CREATININE 0.70 07/30/2021  ? GLUCOSE 160 (H) 07/30/2021  ? ? ? ?Allergies as of 07/30/2021   ? ?   Reactions  ? Alaway [ketotifen Fumarate]   ? Eye drops - causes eye irritation, redness and swelling   ? ?  ? ?  ?Medication List  ?  ? ?TAKE these medications   ? ?Calcium Carbonate-Vitamin D 600-3.125 MG-MCG Tabs ?Take 1 tablet by mouth daily. ?  ?cetirizine 10 MG tablet ?Commonly known as: ZYRTEC ?Take 10 mg by mouth daily. ?  ?COLLAGEN-VITAMIN C PO ?Take 2 capsules by mouth daily. ?  ?[facility] DAILY PROBIOTIC PO ?Take 1 capsule by mouth daily. ?  ?docusate sodium 100 MG capsule ?Commonly known as: Colace ?Take 1 capsule (100 mg total) by mouth 2 (two) times daily. ?  ?Eliquis 5 MG Tabs tablet ?Generic drug: apixaban ?TAKE 1 TABLET TWICE A DAY ?  ?Estradiol 10 MCG Tabs vaginal tablet ?Place 1 tablet vaginally 2 (two) times a week. ?  ?fluticasone 50 MCG/ACT nasal spray ?Commonly known as: FLONASE ?Place 1 spray into both nostrils  as needed for allergies or rhinitis. ?  ?HYDROcodone-acetaminophen 5-325 MG tablet ?Commonly known as: NORCO/VICODIN ?Take 1 tablet by mouth every 4 (four) hours as needed for moderate pain (pain score 4-6). ?  ?Krill Oil 350 MG Caps ?Take 350 mg by mouth daily. ?  ?Magnesium 250 MG Tabs ?Take 250 mg by mouth daily. ?  ?metoprolol tartrate 25 MG tablet ?Commonly known as: LOPRESSOR ?Take 1 tablet (25 mg total) by mouth 2 (two) times daily. ?  ?metroNIDAZOLE 0.75 % gel ?Commonly known as: METROGEL ?Apply 1 application. topically daily. ?  ?multivitamin tablet ?Take 1 tablet by mouth daily. ?  ?ondansetron 4 MG tablet ?Commonly known as: ZOFRAN ?Take 1 tablet (4 mg total) by mouth every 6 (six) hours as needed for nausea. ?  ?polyethylene glycol 17 g packet ?Commonly known as:  MIRALAX / GLYCOLAX ?Take 17 g by mouth daily as needed for mild constipation. ?  ?rosuvastatin 10 MG tablet ?Commonly known as: CRESTOR ?Take 10 mg by mouth at bedtime. ?  ?senna 8.6 MG Tabs tablet ?Commonly known as: SENOKOT ?Take 2 tablets (17.2 mg total) by mouth at bedtime. ?  ?Systane Complete 0.6 % Soln ?Generic drug: Propylene Glycol ?Place 1 drop into both eyes 2 (two) times daily as needed (dry eyes). ?  ? ?  ? ?  ?  ? ? ?  ?Discharge Care Instructions  ?(From admission, onward)  ?  ? ? ?  ? ?  Start     Ordered  ? 07/30/21 0000  Change dressing       ?Comments: Leave aquacel dressing on knee until follow-up visit.  ?Every 8 hours:  ?1. empty the drain  ?2. Record amount in the drain  ?3. Recharge the drain  ?4. Keep a log of the amount emptied each time.  ?Bring log with the drain amounts to your follow-up visit in 2 weeks.  ? 07/30/21 1454  ? ?  ?  ? ?  ?  ? ? ?WEIGHT BEARING  ? ?Weight bearing as tolerated with assist device (walker, cane, etc) as directed, use it as long as suggested by your surgeon or therapist, typically at least 4-6 weeks. ? ? ?EXERCISES ? ?Results after joint replacement surgery are often greatly improved when you follow the exercise, range of motion and muscle strengthening exercises prescribed by your doctor. Safety measures are also important to protect the joint from further injury. Any time any of these exercises cause you to have increased pain or swelling, decrease what you are doing until you are comfortable again and then slowly increase them. If you have problems or questions, call your caregiver or physical therapist for advice.  ? ?Rehabilitation is important following a joint replacement. After just a few days of immobilization, the muscles of the leg can become weakened and shrink (atrophy).  These exercises are designed to build up the tone and strength of the thigh and leg muscles and to improve motion. Often times heat used for twenty to thirty minutes before  working out will loosen up your tissues and help with improving the range of motion but do not use heat for the first two weeks following surgery (sometimes heat can increase post-operative swelling).  ? ?These exercises can be done on a training (exercise) mat, on the floor, on a table or on a bed. Use whatever works the best and is most comfortable for you.    Use music or television while you are exercising so that the exercises are  a pleasant break in your day. This will make your life better with the exercises acting as a break in your routine that you can look forward to.   Perform all exercises about fifteen times, three times per day or as directed.  You should exercise both the operative leg and the other leg as well. ? ?Exercises include: ?  ?Quad Sets - Tighten up the muscle on the front of the thigh (Quad) and hold for 5-10 seconds.   ?Straight Leg Raises - With your knee straight (if you were given a brace, keep it on), lift the leg to 60 degrees, hold for 3 seconds, and slowly lower the leg.  Perform this exercise against resistance later as your leg gets stronger.  ?Leg Slides: Lying on your back, slowly slide your foot toward your buttocks, bending your knee up off the floor (only go as far as is comfortable). Then slowly slide your foot back down until your leg is flat on the floor again.  ?Angel Wings: Lying on your back spread your legs to the side as far apart as you can without causing discomfort.  ?Hamstring Strength:  Lying on your back, push your heel against the floor with your leg straight by tightening up the muscles of your buttocks.  Repeat, but this time bend your knee to a comfortable angle, and push your heel against the floor.  You may put a pillow under the heel to make it more comfortable if necessary.  ? ?A rehabilitation program following joint replacement surgery can speed recovery and prevent re-injury in the future due to weakened muscles. Contact your doctor or a physical  therapist for more information on knee rehabilitation.  ? ? ?CONSTIPATION ? ?Constipation is defined medically as fewer than three stools per week and severe constipation as less than one stool per week.  Even if y

## 2021-07-30 NOTE — TOC Transition Note (Signed)
Transition of Care (TOC) - CM/SW Discharge Note ? ?Patient Details  ?Name: Katrina Oliver ?MRN: [id number] ?Date of Birth: 09-12-1945 ? ?Transition of Care (TOC) CM/SW Contact:  ?Sherie Don, LCSW ?Phone Number: ?07/30/2021, 9:50 AM ? ?Clinical Narrative: Patient is expected to discharge home after working with PT. CSW met with patient to confirm discharge plan and needs. Patient will discharge home with a home exercise program (HEP). Patient will need a rolling walker, which was delivered to the room by [employer]. TOC signing off. ? ?Final next level of care: Home/Self Care ?Barriers to Discharge: No Barriers Identified ? ?Patient Goals and CMS Choice ?Patient states their goals for this hospitalization and ongoing recovery are:: Discharge home with HEP ?CMS Medicare.gov Compare Post Acute Care list provided to:: Patient ?Choice offered to / list presented to : Patient ? ?Discharge Plan and Services         ?DME Arranged: Walker rolling ?DME Agency: [employer] ?Representative spoke with at DME Agency: Prearranged in orthopedist's office ? ?Readmission Risk Interventions ?   ? View : No data to display.  ?  ?  ?  ? ?

## 2021-07-30 NOTE — Progress Notes (Signed)
Patient discharged to home w/ family. Given all belongings, instructions, equipment. Verbalized understanding of instructions. Escorted to pov via w/c. 

## 2021-07-30 NOTE — Progress Notes (Addendum)
Critical potassium of 2.2 and calcium of 4.3. Paged [facility]. ? ? ? ? ? ? ?ADDENDUM: lab values are universally unexpected. Will repeat labs STAT. ? ?Bertram Savin, MD ?7:12 AM ? ?

## 2021-07-30 NOTE — Evaluation (Signed)
Physical Therapy Evaluation ?Patient Details ?Name: Katrina PiggsJacqueline T Reisz ?MRN: [id number] ?DOB: 01/14/1946 ?Today's Date: 07/30/2021 ? ?History of Present Illness ? Pt is a 76 y.o. female s/p R THA, anterior approach on 07/29/21. PMH significant for a-fib, dysrhythmia, HTN, and OA.  ?Clinical Impression ? Pt is POD #1 s/p Rt THA resulting in the deficits listed below (see PT Problem List). Pt performed sit to stand transfers and ambulation with supervision safety and cues for safe hand placement. Pt ambulated total of ~14160ft with supervision, no overt LOB observed. Pt performed stair negotiation with MIN guard and cues for sequencing. Pt and family educated on proper guarding position when receiving assistance at home. PT reviewed LE HEP for promotion of DVT prevention and improved strength/ROM, pt demonstrated understanding. Pt will have 24hr assist from multiple family members upon d/c. Pt will benefit from continued skilled PT to maximize functional mobility and increase independence.  ?   ?   ? ?Recommendations for follow up therapy are one component of a multi-disciplinary discharge planning process, led by the attending physician.  Recommendations may be updated based on patient status, additional functional criteria and insurance authorization. ? ?Follow Up Recommendations Follow physician's recommendations for discharge plan and follow up therapies ? ?  ?Assistance Recommended at Discharge Set up Supervision/Assistance  ?Patient can return home with the following ? A little help with walking and/or transfers;A little help with bathing/dressing/bathroom;Assist for transportation;Help with stairs or ramp for entrance;Assistance with cooking/housework ? ?  ?Equipment Recommendations None recommended by PT (RW already delivered to pt's room)  ?Recommendations for Other Services ?    ?  ?Functional Status Assessment Patient has had a recent decline in their functional status and demonstrates the ability to make  significant improvements in function in a reasonable and predictable amount of time.  ? ?  ?Precautions / Restrictions Precautions ?Precautions: Fall ?Restrictions ?Weight Bearing Restrictions: No ?Other Position/Activity Restrictions: WBAT  ? ?  ? ?Mobility ? Bed Mobility ?Overal bed mobility: Needs Assistance ?Bed Mobility: Supine to Sit ?  ?  ?Supine to sit: Supervision, HOB elevated ?  ?  ?  ?  ? ?Transfers ?Overall transfer level: Needs assistance ?Equipment used: Rolling walker (2 wheels) ?Transfers: Sit to/from Stand ?Sit to Stand: Supervision ?  ?  ?  ?  ?  ?General transfer comment: X1 from EOB, X1 from toilet seat. Cues for hand placement. ?  ? ?Ambulation/Gait ?Ambulation/Gait assistance: Supervision ?Gait Distance (Feet): 160 Feet ?Assistive device: Rolling walker (2 wheels) ?Gait Pattern/deviations: Step-to pattern, Step-through pattern, Decreased stride length, Decreased weight shift to right ?Gait velocity: decr ?  ?  ?General Gait Details: able to progress to step through with no LOB observed. ? ?Stairs ?Stairs: Yes ?Stairs assistance: Min guard ?Stair Management: Two rails, Forwards ?Number of Stairs: 5 ?General stair comments: Able to perform stair negotiation with cues for sequencing. Pt's spouse arriving during stair training, pt and spouse educated on proper guarding position for family when assisting at home. ? ?Wheelchair Mobility ?  ? ?Modified Rankin (Stroke Patients Only) ?  ? ?  ? ?Balance Overall balance assessment: Needs assistance ?Sitting-balance support: Feet supported ?Sitting balance-Leahy Scale: Good ?  ?  ?Standing balance support: Bilateral upper extremity supported, During functional activity, No upper extremity supported ?Standing balance-Leahy Scale: Fair ?Standing balance comment: able to stand and perform hand hygiene without UE support ?  ?  ?  ?  ?  ?  ?  ?  ?  ?  ?  ?   ? ? ? ?  Pertinent Vitals/Pain Pain Assessment ?Pain Assessment: 0-10 ?Pain Score: 4  ?Pain Location: R  hip ?Pain Descriptors / Indicators: Discomfort, Aching, Sharp, Throbbing ?Pain Intervention(s): Limited activity within patient's tolerance, Monitored during session, Repositioned, Ice applied  ? ? ?Home Living Family/patient expects to be discharged to:: Private residence ?Living Arrangements: Spouse/significant other ?Available Help at Discharge: Family ?Type of Home: House ?Home Access: Stairs to enter ?Entrance Stairs-Rails: Can reach both ?Entrance Stairs-Number of Steps: 2 ?  ?Home Layout: One level ?Home Equipment: Agricultural consultant (2 wheels);Shower seat - built in;Hand held shower head (has additional shower with grab bars) ?   ?  ?Prior Function Prior Level of Function : Independent/Modified Independent;Driving ?  ?  ?  ?  ?  ?  ?Mobility Comments: no AD use prior to surgery ?  ?  ? ? ?Hand Dominance  ? Dominant Hand: Right ? ?  ?Extremity/Trunk Assessment  ? Upper Extremity Assessment ?Upper Extremity Assessment: Overall WFL for tasks assessed ?  ? ?Lower Extremity Assessment ?Lower Extremity Assessment: RLE deficits/detail ?RLE Deficits / Details: good quad set strength. B DF/PF 5/5 ?  ? ?Cervical / Trunk Assessment ?Cervical / Trunk Assessment: Normal  ?Communication  ? Communication: No difficulties  ?Cognition Arousal/Alertness: Awake/alert ?Behavior During Therapy: [facility] for tasks assessed/performed ?Overall Cognitive Status: Within Functional Limits for tasks assessed ?  ?  ?  ?  ?  ?  ?  ?  ?  ?  ?  ?  ?  ?  ?  ?  ?  ?  ?  ? ?  ?General Comments General comments (skin integrity, edema, etc.): Pt reports she does hamstring stretch in sidelying in mornings, educated on avoiding hyperextension at this time to allow for healing, Pt verbalized understanding. ? ?  ?Exercises Total Joint Exercises ?Ankle Circles/Pumps: AROM, Both, 20 reps, Seated ?Quad Sets: AROM, Right, 10 reps, Seated ?Short Arc Quad: AROM, Right, 10 reps, Seated ?Heel Slides: AROM, Right, 10 reps, Seated ?Hip ABduction/ADduction: AROM,  Right, 10 reps, Seated ?Long Arc Quad: AROM, Right, 10 reps, Seated  ? ?Assessment/Plan  ?  ?PT Assessment Patient needs continued PT services  ?PT Problem List Decreased strength;Decreased range of motion;Decreased activity tolerance;Decreased balance;Decreased mobility;Decreased knowledge of use of DME;Pain ? ?   ?  ?PT Treatment Interventions DME instruction;Gait training;Stair training;Functional mobility training;Therapeutic exercise;Therapeutic activities;Balance training;Patient/family education   ? ?PT Goals (Current goals can be found in the Care Plan section)  ?Acute Rehab PT Goals ?Patient Stated Goal: have good recovery and get back to independence ?PT Goal Formulation: With patient ?Time For Goal Achievement: 08/13/21 ?Potential to Achieve Goals: Good ? ?  ?Frequency 7X/week ?  ? ? ?Co-evaluation   ?  ?  ?  ?  ? ? ?  ?AM-PAC PT "6 Clicks" Mobility  ?Outcome Measure Help needed turning from your back to your side while in a flat bed without using bedrails?: None ?Help needed moving from lying on your back to sitting on the side of a flat bed without using bedrails?: A Little ?Help needed moving to and from a bed to a chair (including a wheelchair)?: A Little ?Help needed standing up from a chair using your arms (e.g., wheelchair or bedside chair)?: A Little ?Help needed to walk in hospital room?: A Little ?Help needed climbing 3-5 steps with a railing? : A Little ?6 Click Score: 19 ? ?  ?End of Session Equipment Utilized During Treatment: Gait belt ?Activity Tolerance: Patient tolerated treatment well ?Patient left: in  chair;with call bell/phone within reach;with chair alarm set;with family/visitor present ?Nurse Communication: Mobility status ?PT Visit Diagnosis: Muscle weakness (generalized) (M62.81);Pain ?Pain - Right/Left: Right ?Pain - part of body: Hip ?  ? ?Time: 2409-7353 ?PT Time Calculation (min) (ACUTE ONLY): 35 min ? ? ?Charges:   PT Evaluation ?$PT Eval Low Complexity: 1 Low ?PT  Treatments ?$Therapeutic Exercise:  (7 min) ?$Therapeutic Activity: 8-22 mins ?  ?   ? ? ?Lyman Speller., PT, DPT  ?Acute Rehabilitation Services  ?Office [phone] ?07/30/2021, 1:56 PM ? ?

## 2021-07-30 NOTE — Progress Notes (Addendum)
? ? ?  Subjective: ? ?Patient reports pain as mild to moderate.  Denies N/V/CP/SOB. Patient is very tearful this morning. She is upset with having to be stuck so many times for blood draw. She states she is a difficult stick. She is experiencing some thigh pain last night to this morning, we discussed this was to be expected from surgery. Pain is controlled with ice and meds.  ? ?Objective:  ? ?VITALS:   ?Vitals:  ? 07/29/21 2000 07/29/21 2037 07/30/21 0003 07/30/21 0329  ?BP:  (!) 149/70 (!) 155/64 (!) 158/51  ?Pulse: 65 63 64 68  ?Resp: 11 15 16 15   ?Temp:  98.1 ?F (36.7 ?C) 97.8 ?F (36.6 ?C) 98.8 ?F (37.1 ?C)  ?TempSrc:   Oral Oral  ?SpO2: 93% 97% 97% 97%  ?Weight:      ?Height:      ? ?JP drain: 30 cc SS fluid ? ?NAD ?Neurologically intact ?ABD soft ?Neurovascular intact ?Sensation intact distally ?Intact pulses distally ?Dorsiflexion/Plantar flexion intact ?Incision: dressing C/D/I ?No cellulitis present ?Compartment soft ?JP drain intact. ?Painless log rolling of the hip.  ? ?Lab Results  ?Component Value Date  ? WBC 6.0 07/30/2021  ? HGB 8.5 (L) 07/30/2021  ? HCT 27.6 (L) 07/30/2021  ? MCV 103.4 (H) 07/30/2021  ? PLT 140 (L) 07/30/2021  ? ?BMET ?   ?Component Value Date/Time  ? NA 143 07/30/2021 0312  ? K 2.2 (LL) 07/30/2021 [id number]  ? CL 126 (H) 07/30/2021 [id number]  ? CO2 14 (L) 07/30/2021 [id number]  ? GLUCOSE 117 (H) 07/30/2021 [id number]  ? BUN 9 07/30/2021 0312  ? CREATININE 0.32 (L) 07/30/2021 [id number]  ? CALCIUM 4.3 (LL) 07/30/2021 [id number]  ? GFRNONAA >60 07/30/2021 0312  ? ?Labs reviewed. K+ low at 2.2, Calcium low 4.3. ?Hemoglobin 8.5.  ? ? ?Assessment/Plan: ?1 Day Post-Op  ? ?Principal Problem: ?  Osteoarthritis of right hip ?Active Problems: ?  Primary osteoarthritis of right hip ? ? ?WBAT with walker ?DVT ppx: Eliquis, SCDs, TEDS ?PO pain control ?PT/OT: will come by this afternoon. ?Dispo: D/c home once labs are stabilized and cleared by PT.  ?Advance diet ?Up with therapy ?STAT labs drawn this morning for low potassium  2.2 and calcium 4.3. Patient was given calcium gluconate once for critical labs. Will continue to monitor. ?Hemoglobin 8.5 STAT lab drawn this morning. Will continue to monitor. Transfuse if hemoglobin <7.  ? ? ?[place] ?07/30/2021, 7:22 AM ? ? ?Rod Can, MD ?[phone] ?[place] [facility] [employer]  [place] ?[address], [place], [place] [address] ?Phone: [phone] ?[email or web address] ?Facebook  Engineer, structural  ?  ?  ? ? ? ?ADDENDUM: repeat BMP done. K+ WNL, Ca2+ mildly decreased. ?

## 2021-07-31 ENCOUNTER — Encounter (HOSPITAL_COMMUNITY): Payer: Self-pay | Admitting: Orthopedic Surgery

## 2021-08-03 ENCOUNTER — Encounter (HOSPITAL_COMMUNITY): Payer: Self-pay | Admitting: Orthopedic Surgery

## 2021-12-08 ENCOUNTER — Other Ambulatory Visit (HOSPITAL_COMMUNITY): Payer: Self-pay | Admitting: Nurse Practitioner

## 2021-12-08 NOTE — Telephone Encounter (Signed)
Pt still goes to the A-Fib clinic. Dr. Excell Seltzer only saw pt for cardiac clearance. Please address

## 2022-02-19 ENCOUNTER — Other Ambulatory Visit (HOSPITAL_COMMUNITY): Payer: Self-pay

## 2022-02-19 MED ORDER — APIXABAN 5 MG PO TABS: ORAL_TABLET | ORAL | 0 refills | Status: DC

## 2022-02-23 ENCOUNTER — Other Ambulatory Visit (HOSPITAL_COMMUNITY): Payer: Self-pay

## 2022-02-23 MED ORDER — APIXABAN 5 MG PO TABS: ORAL_TABLET | ORAL | 2 refills | Status: DC

## 2022-04-06 ENCOUNTER — Encounter (HOSPITAL_COMMUNITY): Payer: Self-pay | Admitting: Nurse Practitioner

## 2022-04-06 ENCOUNTER — Ambulatory Visit (HOSPITAL_COMMUNITY)
Admission: RE | Admit: 2022-04-06 | Discharge: 2022-04-06 | Disposition: A | Discharge status: Home / Self Care | Payer: Federal, State, Local not specified - PPO | Source: Ambulatory Visit | Attending: Nurse Practitioner | Admitting: Nurse Practitioner

## 2022-04-06 VITALS — BP 188/88 | HR 67 | Ht 62.5 in | Wt 131.0 lb

## 2022-04-06 DIAGNOSIS — D6869 Other thrombophilia: Secondary | ICD-10-CM | POA: Diagnosis not present

## 2022-04-06 DIAGNOSIS — I10 Essential (primary) hypertension: Secondary | ICD-10-CM | POA: Diagnosis not present

## 2022-04-06 DIAGNOSIS — Z7901 Long term (current) use of anticoagulants: Secondary | ICD-10-CM | POA: Diagnosis not present

## 2022-04-06 DIAGNOSIS — I480 Paroxysmal atrial fibrillation: Secondary | ICD-10-CM | POA: Diagnosis not present

## 2022-04-06 DIAGNOSIS — Z79899 Other long term (current) drug therapy: Secondary | ICD-10-CM | POA: Insufficient documentation

## 2022-04-06 NOTE — Progress Notes (Signed)
Patient ID: ZAMARA COZAD, female   DOB: 18-Nov-1945, 76 y.o.   MRN: [id number]        Primary Care Physician: Dr. Shaune Pollack Referring Physician: Same    Katrina Oliver is a 76 y.o. female with a h/o PAF that she first noticed very brief episodes that lasted 1-2 hours. She presented to the PCP office in March with an epiosode of irregular heart beat but by the time she got there, EKG showed SR. She presented to her PCP office yesterday and was found to have afib with rvr at 138 bpm. She was started on metoprolol and asked to be seen here today.  Now in SR.She does have a CHA2DS2VASc of 2  and would by guidelines be a candidate for anticoagulant. No bleeding issues.  Will stop asa. No previous echo.  Lifestyle issues reviewed and pt  states she is active at the gym, normal weight, no snoring history, no tobacco, does drink two alcoholic drinks a night.She was started on eliquis and echo was ordered.  She returns 09/17/15, no further afib, but is c/o fatigue probably from metoprolol. Echo showed normal structure heart. No bleeding issues with eliquis.  F/u 12/23/16- She reports that she is doing well. Only had one episode of irregular heart beat that resolved within one hour with an extra metoprolol in the last three months. Cutting dose of metoprolol helped with the feeling of fatigue. No issues with  DOAC.   F/u in afib clinic 12/12, she reports in 9 months, 2 episodes of afib both lasting less than 2 hours each, converted with extra dose of metoprolol.No issues with eliquis.  F/u in afib clinic, 12/12.  She has done well over the last year. Has had 2 episodes of afib lasted less than 2 hours. Usually  Takes an extra metoprolol for this. Doing well on her DOAC, no signsof bleeding. WIll obtain labs(CBC/BMET) drawn with PCP this past June for review.  F/u in afib clinic, 04/10/19, for yearly visit. She has had 3 episodes over the last year. Each converted within the hour with extra  BB. She also reports that she has had several episodes of feeling discomfort between her shoulder blades with a feeling of having to burp over the last few months. She had this several years ago and stress test was normal. The d/c will be relieved  with a Tums. Usually is close to after eating with exertional activities. She has just downsized and moved last week, a lot of heavy lifting with boxes.  No issues with bleeding with eliquis 5 mg bid with a CHA2DS2VASc score of 3, (age and turns 69 on the 27th)  F/u 04/08/20. She has had 4 episodes of afib over the last year. Last  around 4 hours at a time. Converts with an extra dose of BB. The chest discomfort she described last year during visit improved. The only time she notices"indigestion" is when she ate late and then did an AM exercise class. The chest burning is relived with one tum. Otherwise no complaints and continues on DOAC without issues with a CHA2DS2VASc score of 3. BP is elevated today but runs 120-130 systolic at home.   F/u 04/09/21. She  has only had 2-3 episodes of afib over the last year with episodes lasting less than one hour after taking an extra 1/2 tab of metoprolol Her BP has been creeping up and her PCP wanted to add amlodipine but she opted to increase her metoprolol to a  full 25 mg bid. BP's at home look acceptable. BP elevated here so advised for her to go home and check later today. If her BP's are consistently  running over 140 systolic then amlodipine would be appropriate to add.   F/u in afib clinic, 04/06/22. She reports 5-6 episodes of afib over the last year. Can take 1/2 metoprolol and rhythm is restored  in an hour. With paucity of symptoms/episodes, she does not feel the need to change meds to manage afib. She had her hip replaced last April and did well. Her BP is elevated today but very well managed at home, readings reviewed.   Today, she denies symptoms of palpitations, chest pain, shortness of breath, orthopnea,  PND, lower extremity edema, dizziness, presyncope, syncope, or neurologic sequela. The patient is tolerating medications without difficulties and is otherwise without complaint today.   Past Medical History:  Diagnosis Date   A-fib [facility])    Aortic atherosclerosis (HCC)    Dysrhythmia    Hip pain    HTN (hypertension)    Hypercholesteremia    Osteoarthritis    PAF (paroxysmal atrial fibrillation) (HCC)      Current Outpatient Medications  Medication Sig Dispense Refill   acetaminophen (TYLENOL) 500 MG tablet Take one as needed as directed Indications: pain     apixaban (ELIQUIS) 5 MG TABS tablet Take one tablet by mouth twice daily 180 tablet 2   Calcium Carbonate-Vitamin D 600-3.125 MG-MCG TABS Take 1 tablet by mouth daily.     cetirizine (ZYRTEC) 10 MG tablet Take 10 mg by mouth daily.     Estradiol 10 MCG TABS vaginal tablet Place 1 tablet vaginally 2 (two) times a week.     fluticasone (FLONASE) 50 MCG/ACT nasal spray Place 1 spray into both nostrils as needed for allergies or rhinitis.     Krill Oil 350 MG CAPS Take 350 mg by mouth daily.     Magnesium 250 MG TABS Take 250 mg by mouth daily.     metoprolol tartrate (LOPRESSOR) 25 MG tablet Take one tablet by mouth twice daily 180 tablet 1   metroNIDAZOLE (METROGEL) 0.75 % gel Apply 1 application. topically daily.     Multiple Vitamin (MULTIVITAMIN) tablet Take 1 tablet by mouth daily.     Multiple Vitamins-Minerals (VITAMIN-MINERAL SUPPLEMENT PO) Name: Advanced Collagen: Take one every day for supplement.     Probiotic Product ([employer] DAILY PROBIOTIC PO) Take 1 capsule by mouth daily.     Propylene Glycol (SYSTANE COMPLETE) 0.6 % SOLN Place 1 drop into both eyes 2 (two) times daily as needed (dry eyes).     rosuvastatin (CRESTOR) 10 MG tablet Take 10 mg by mouth at bedtime.     No current facility-administered medications for this encounter.    Allergies  Allergen Reactions   Other Other (See Comments)    alloway eye drops    Alaway [Ketotifen Fumarate]     Eye drops - causes eye irritation, redness and swelling    Pollen Extract     Trees, Grass    Social History   Socioeconomic History   Marital status: Married    Spouse name: Not on file   Number of children: Not on file   Years of education: Not on file   Highest education level: Not on file  Occupational History   Not on file  Tobacco Use   Smoking status: Former    Packs/day: 0.25    Years: 30.00    Total pack years:  7.50    Types: Cigarettes    Quit date: 2006    Years since quitting: 17.9   Smokeless tobacco: Never  Vaping Use   Vaping Use: Never used  Substance and Sexual Activity   Alcohol use: Yes    Alcohol/week: 12.0 standard drinks of alcohol    Types: 12 Standard drinks or equivalent per week    Comment: daily   Drug use: Never   Sexual activity: Not on file  Other Topics Concern   Not on file  Social History Narrative   Not on file   Social Determinants of Health   Financial Resource Strain: Not on file  Food Insecurity: Not on file  Transportation Needs: Not on file  Physical Activity: Not on file  Stress: Not on file  Social Connections: Not on file  Intimate Partner Violence: Not on file    No family history on file.  ROS- All systems are reviewed and negative except as per the HPI above  Physical Exam: Vitals:   04/06/22 0827  BP: (!) 188/88  Pulse: 67  Weight: 59.4 kg  Height: 5' 2.5" (1.588 m)    GEN- The patient is well appearing, alert and oriented x 3 today.   Head- normocephalic, atraumatic Eyes-  Sclera clear, conjunctiva pink Ears- hearing intact Oropharynx- clear Neck- supple, no JVP Lymph- no cervical lymphadenopathy Lungs- Clear to ausculation bilaterally, normal work of breathing Heart- Regular rate and rhythm, no murmurs, rubs or gallops, PMI not laterally displaced GI- soft, NT, ND, + BS Extremities- no clubbing, cyanosis, or edema MS- no significant deformity or atrophy Skin-  no rash or lesion Psych- euthymic mood, full affect Neuro- strength and sensation are intact   EKG-Vent. rate 67 BPM PR interval 136 ms QRS duration 72 ms QT/QTcB 414/437 ms P-R-T axes 83 95 37 Normal sinus rhythm Rightward axis Borderline ECG When compared with ECG of 09-Apr-2021 09:21, PREVIOUS ECG IS PRESENT   - Left ventricle: The cavity size was normal. Systolic function was   normal. The estimated ejection fraction was in the range of 55%   to 60%. Wall motion was normal; there were no regional wall   motion abnormalities. Left ventricular diastolic function   parameters were normal. - Mitral valve: There was mild regurgitation.  Assessment and Plan: 1. PAF with very low AF burden Continue metoprolol 25 mg bid  Can take an extra 1/2 tab of metoprolol for breakthrough episodes  2. Chadsvasc score of 3 Continue eliquis 5 mg bid  No bleeding issues  3. HTN Elevated on presentation  Continue to check BP's at home which are controlled   F/u one year in afib clinic, sooner if burden increases    [facility]rina [facility] [address] [place], [place] [address] [phone]

## 2022-06-03 ENCOUNTER — Encounter (HOSPITAL_COMMUNITY): Payer: Self-pay | Admitting: *Deleted

## 2022-06-21 ENCOUNTER — Other Ambulatory Visit (HOSPITAL_COMMUNITY): Payer: Self-pay | Admitting: Nurse Practitioner

## 2022-12-07 ENCOUNTER — Other Ambulatory Visit (HOSPITAL_COMMUNITY): Payer: Self-pay

## 2022-12-07 MED ORDER — METOPROLOL TARTRATE 25 MG PO TABS: ORAL_TABLET | ORAL | 1 refills | Status: DC

## 2022-12-07 MED ORDER — APIXABAN 5 MG PO TABS: ORAL_TABLET | ORAL | 1 refills | Status: DC

## 2023-04-06 ENCOUNTER — Ambulatory Visit (HOSPITAL_COMMUNITY)
Admission: RE | Admit: 2023-04-06 | Discharge: 2023-04-06 | Disposition: A | Discharge status: Home / Self Care | Payer: Federal, State, Local not specified - PPO | Source: Ambulatory Visit | Attending: Physician Assistant | Admitting: Physician Assistant

## 2023-04-06 VITALS — BP 194/84 | HR 71 | Ht 62.5 in | Wt 132.8 lb

## 2023-04-06 DIAGNOSIS — D6869 Other thrombophilia: Secondary | ICD-10-CM | POA: Insufficient documentation

## 2023-04-06 DIAGNOSIS — I700 Atherosclerosis of aorta: Secondary | ICD-10-CM | POA: Insufficient documentation

## 2023-04-06 DIAGNOSIS — Z7901 Long term (current) use of anticoagulants: Secondary | ICD-10-CM | POA: Diagnosis not present

## 2023-04-06 DIAGNOSIS — R9431 Abnormal electrocardiogram [ECG] [EKG]: Secondary | ICD-10-CM | POA: Diagnosis not present

## 2023-04-06 DIAGNOSIS — I10 Essential (primary) hypertension: Secondary | ICD-10-CM | POA: Diagnosis present

## 2023-04-06 DIAGNOSIS — E785 Hyperlipidemia, unspecified: Secondary | ICD-10-CM | POA: Diagnosis present

## 2023-04-06 DIAGNOSIS — Z79899 Other long term (current) drug therapy: Secondary | ICD-10-CM | POA: Diagnosis not present

## 2023-04-06 DIAGNOSIS — I480 Paroxysmal atrial fibrillation: Secondary | ICD-10-CM | POA: Insufficient documentation

## 2023-04-06 NOTE — Progress Notes (Signed)
Primary Care Physician: Dr. Shaune Pollack Referring Physician: Same  Primary Cardiologist: Dr Ronalee Red is a 77 y.o. female with a h/o HTN, aortic atherosclerosis, HLD, and atrial fibrillation who presents for follow up in the [facility]. Patient is on Eliquis for a CHADS2VASC score of 5.  On follow up today, patient reports that she has had 3-4 episodes of afib over the past year, the longest of which lasted 30 minutes. She has identified stress as a trigger for her. She denies any bleeding issues on anticoagulation.    Today, she denies symptoms of palpitations, chest pain, shortness of breath, orthopnea, PND, lower extremity edema, dizziness, presyncope, syncope, or neurologic sequela. The patient is tolerating medications without difficulties and is otherwise without complaint today.   Past Medical History:  Diagnosis Date   A-fib [facility])    Aortic atherosclerosis (HCC)    Dysrhythmia    Hip pain    HTN (hypertension)    Hypercholesteremia    Osteoarthritis    PAF (paroxysmal atrial fibrillation) (HCC)      Current Outpatient Medications  Medication Sig Dispense Refill   acetaminophen (TYLENOL) 500 MG tablet Take one as needed as directed Indications: pain     apixaban (ELIQUIS) 5 MG TABS tablet Take one tablet by mouth twice daily 180 tablet 1   Calcium Carbonate-Vitamin D 600-3.125 MG-MCG TABS Take 1 tablet by mouth daily.     cetirizine (ZYRTEC) 10 MG tablet Take 10 mg by mouth daily.     Estradiol 10 MCG TABS vaginal tablet Place 1 tablet vaginally 2 (two) times a week.     fluticasone (FLONASE) 50 MCG/ACT nasal spray Place 1 spray into both nostrils as needed for allergies or rhinitis.     Krill Oil 350 MG CAPS Take 350 mg by mouth daily.     Magnesium 250 MG TABS Take 250 mg by mouth daily.     metoprolol tartrate (LOPRESSOR) 25 MG tablet TAKE 1 TABLET TWICE A DAY 180 tablet 1   metroNIDAZOLE (METROGEL) 0.75 % gel  Apply 1 application. topically daily.     Multiple Vitamin (MULTIVITAMIN) tablet Take 1 tablet by mouth daily.     Multiple Vitamins-Minerals (VITAMIN-MINERAL SUPPLEMENT PO) Name: Advanced Collagen: Take one every day for supplement.     Probiotic Product ([employer]S DAILY PROBIOTIC PO) Take 1 capsule by mouth daily.     Propylene Glycol (SYSTANE COMPLETE) 0.6 % SOLN Place 1 drop into both eyes 2 (two) times daily as needed (dry eyes).     rosuvastatin (CRESTOR) 10 MG tablet Take 10 mg by mouth at bedtime.     No current facility-administered medications for this encounter.    ROS- All systems are reviewed and negative except as per the HPI above  Physical Exam: Vitals:   04/06/23 1024  BP: (!) 194/84  Pulse: 71  Weight: 60.2 kg  Height: 5' 2.5" (1.588 m)     GEN: Well nourished, well developed in no acute distress NECK: No JVD; No carotid bruits CARDIAC: Regular rate and rhythm, no murmurs, rubs, gallops RESPIRATORY:  Clear to auscultation without rales, wheezing or rhonchi  ABDOMEN: Soft, non-tender, non-distended EXTREMITIES:  No edema; No deformity     EKG today demonstrates SR Vent. rate 71 BPM PR interval 122 ms QRS duration 72 ms QT/QTcB 380/412 ms  Echo 2017 - Left ventricle: The cavity size was normal. Systolic function was   normal. The estimated ejection  fraction was in the range of 55%   to 60%. Wall motion was normal; there were no regional wall   motion abnormalities. Left ventricular diastolic function   parameters were normal. - Mitral valve: There was mild regurgitation.    CHA2DS2-VASc Score = 5  The patient's score is based upon: CHF History: 0 HTN History: 1 Diabetes History: 0 Stroke History: 0 Vascular Disease History: 1 (aortic atherosclerosis) Age Score: 2 Gender Score: 1       ASSESSMENT AND PLAN: Paroxysmal Atrial Fibrillation (ICD10:  I48.0) The patient's CHA2DS2-VASc score is 5, indicating a 7.2% annual risk of stroke.   Patient  appears to be maintaining SR with rare episodes.  Continue Lopressor 25 mg BID. May take an extra 1/2 pill for heart racing Continue Eliquis 5 mg BID  Secondary Hypercoagulable State (ICD10:  D68.69) The patient is at significant risk for stroke/thromboembolism based upon her CHA2DS2-VASc Score of 5.  Continue Apixaban (Eliquis).   HTN Elevated today, she has a known h/o white coat HTN She brings in readings from home today which show excellent BP control. No changes today.    Follow up in the AF clinic on one year.    Jorja Loa PA-C Afib Clinic [facility] [address] [place], [place] [address] [phone]

## 2023-05-23 ENCOUNTER — Other Ambulatory Visit (HOSPITAL_COMMUNITY): Payer: Self-pay

## 2023-05-23 MED ORDER — METOPROLOL TARTRATE 25 MG PO TABS: ORAL_TABLET | ORAL | 3 refills | Status: DC

## 2023-05-23 MED ORDER — APIXABAN 5 MG PO TABS
ORAL_TABLET | ORAL | 3 refills | Status: AC
Start: 2023-05-23 — End: ?

## 2023-10-08 IMAGING — RF DG HIP (WITH OR WITHOUT PELVIS) 1V*R*
1 series · 2 of 2 positions shown · non-contrast
Comparison: Hip radiographs 12/04/2020

FLUOROSCOPY:
Fluoroscopy Time: 12 seconds

Radiation Exposure Index: 1.63 mGy

CLINICAL DATA: Total hip arthroplasty.

EXAM:
DG HIP (WITH OR WITHOUT PELVIS) 1V RIGHT

[Series 1: unknown protocol · 0.20mm/px · 2 of 2 slices shown]
[im 1/2]
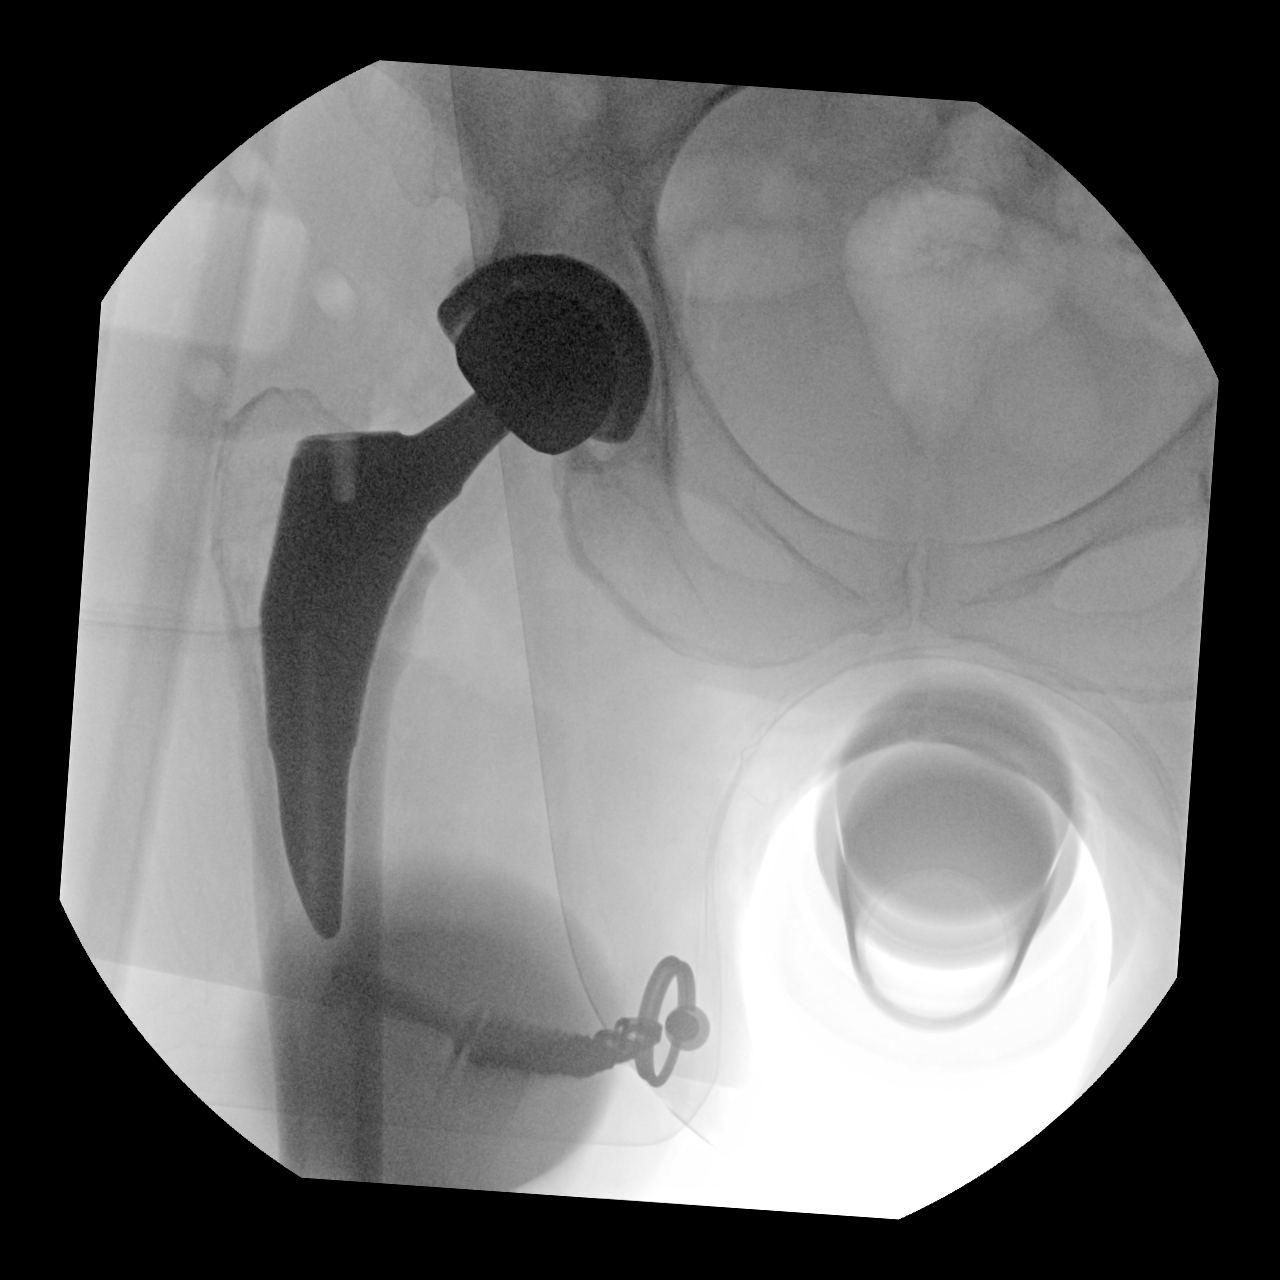
[im 2/2]
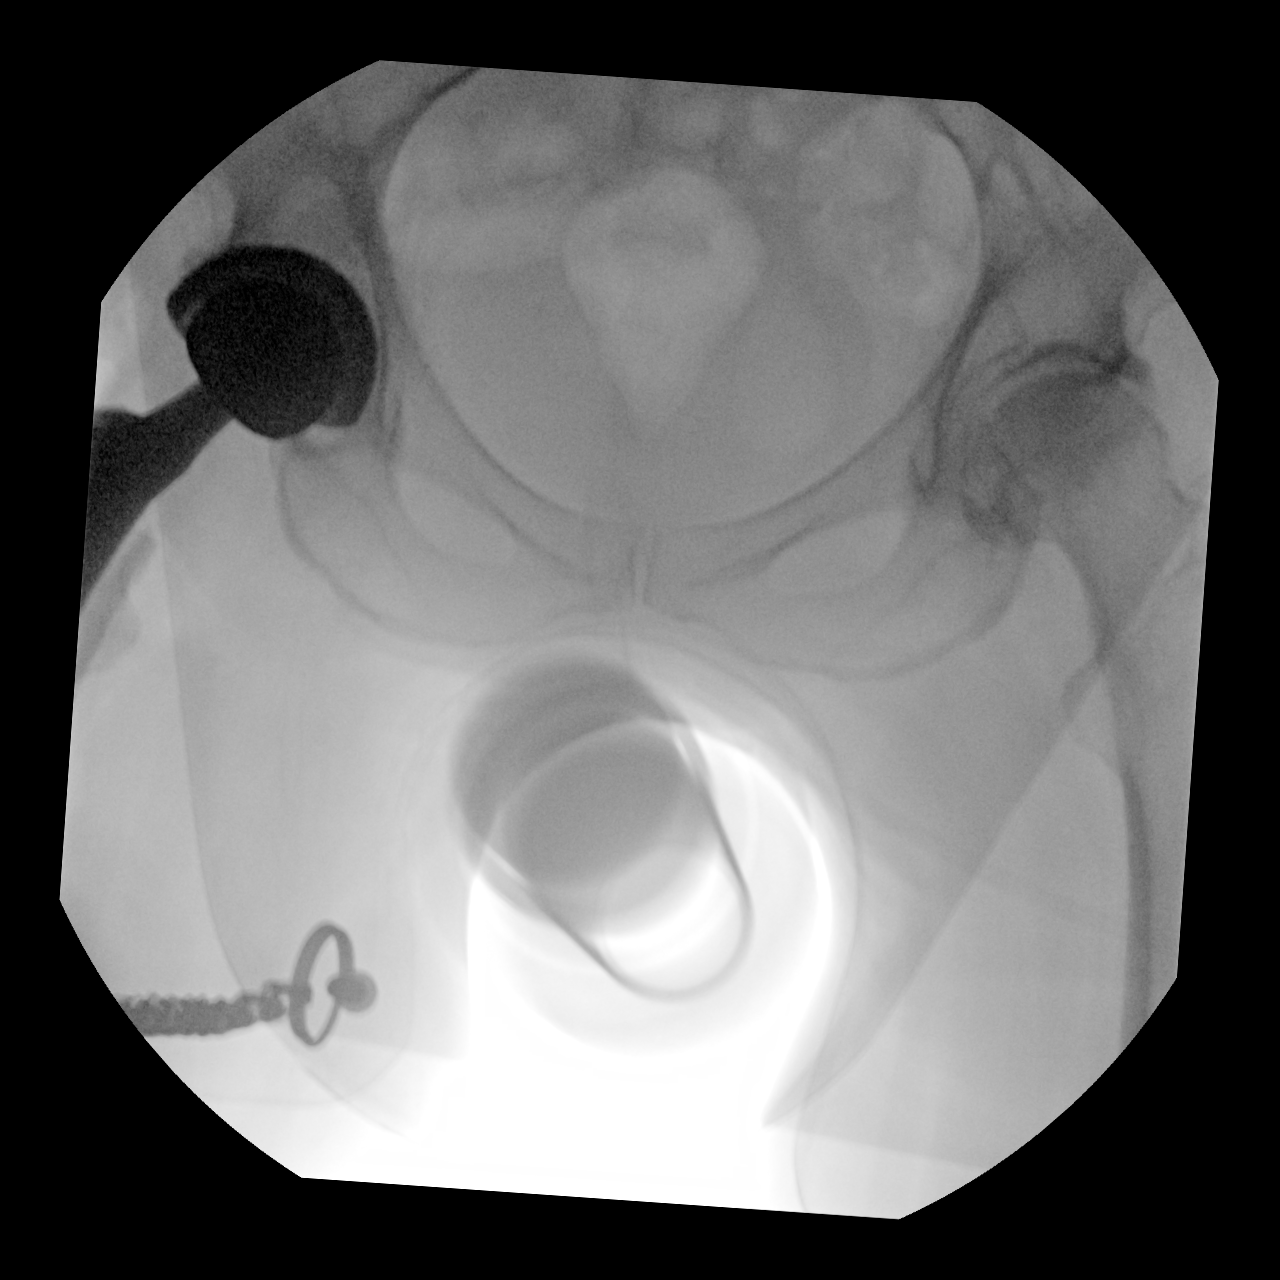

[2 of 2 positions shown; findings below may reference images not displayed]

FINDINGS: Two intraoperative spot fluoroscopic images are provided during
right total hip arthroplasty. The prosthetic components appear
normally positioned, and no acute fracture is identified on these
limited images. Left hip osteoarthrosis is noted.
IMPRESSION: Intraoperative images during right total hip arthroplasty.

## 2023-10-17 ENCOUNTER — Telehealth: Payer: Self-pay | Admitting: *Deleted

## 2023-10-17 NOTE — Telephone Encounter (Signed)
   Pre-operative Risk Assessment    Patient Name: Katrina Oliver  DOB: [birth year] MRN: [id number]   Date of last office visit: 04/06/2023 Date of next office visit: N/A   Request for Surgical Clearance    Procedure:  COLONOSCOPY  Date of Surgery:  Clearance 11/10/23                                Surgeon:  DR. ESTEFANA KEAS Surgeon's Group or Practice Name:  [facility] Phone number:  [phone] Fax number:  [phone]   Type of Clearance Requested:   - Medical  - Pharmacy:  Hold Apixaban  (Eliquis ) PER NOTE,OK TO HOLD 1-2 DAYS PRIOR   Type of Anesthesia:  PROPOFOL    Additional requests/questions:    [facility]   10/17/2023, 1:52 PM

## 2023-10-19 ENCOUNTER — Telehealth: Payer: Self-pay | Admitting: *Deleted

## 2023-10-19 NOTE — Telephone Encounter (Signed)
 Patient with diagnosis of afib on Eliquis  for anticoagulation.    Procedure:  COLONOSCOPY  Date of procedure: 11/10/23   CHA2DS2-VASc Score = 5   This indicates a 7.2% annual risk of stroke. The patient's score is based upon: CHF History: 0 HTN History: 1 Diabetes History: 0 Stroke History: 0 Vascular Disease History: 1 (aortic atherosclerosis) Age Score: 2 Gender Score: 1      CrCl 58 ml/min Platelet count 217  Patient has not had an Afib/aflutter ablation within the last 3 months or DCCV within the last 30 days  Per office protocol, patient can hold Eliquis  for 2 days prior to procedure.    **This guidance is not considered finalized until pre-operative APP has relayed final recommendations.**

## 2023-10-19 NOTE — Telephone Encounter (Signed)
 Pt called back and has been scheduled tele preop appt 10/26/23. Med rec and consent are done.

## 2023-10-19 NOTE — Telephone Encounter (Signed)
Left message for the pt to call to schedule tele pre op appt.  

## 2023-10-19 NOTE — Telephone Encounter (Signed)
 Please set up virtual telephone visit for preop clearance

## 2023-10-19 NOTE — Telephone Encounter (Signed)
 Pt called back and has been scheduled tele preop appt 10/26/23. Med rec and consent are done.      Patient Consent for Virtual Visit        Katrina Oliver has provided verbal consent on 10/19/2023 for a virtual visit (video or telephone).   CONSENT FOR VIRTUAL VISIT FOR:  Katrina Oliver  By participating in this virtual visit I agree to the following:  I hereby voluntarily request, consent and authorize [facility] and its employed or contracted physicians, physician assistants, nurse practitioners or other licensed health care professionals (the Practitioner), to provide me with telemedicine health care services (the "Services) as deemed necessary by the treating Practitioner. I acknowledge and consent to receive the Services by the Practitioner via telemedicine. I understand that the telemedicine visit will involve communicating with the Practitioner through live audiovisual communication technology and the disclosure of certain medical information by electronic transmission. I acknowledge that I have been given the opportunity to request an in-person assessment or other available alternative prior to the telemedicine visit and am voluntarily participating in the telemedicine visit.  I understand that I have the right to withhold or withdraw my consent to the use of telemedicine in the course of my care at any time, without affecting my right to future care or treatment, and that the Practitioner or I may terminate the telemedicine visit at any time. I understand that I have the right to inspect all information obtained and/or recorded in the course of the telemedicine visit and may receive copies of available information for a reasonable fee.  I understand that some of the potential risks of receiving the Services via telemedicine include:  Delay or interruption in medical evaluation due to technological equipment failure or disruption; Information transmitted may not be  sufficient (e.g. poor resolution of images) to allow for appropriate medical decision making by the Practitioner; and/or  In rare instances, security protocols could fail, causing a breach of personal health information.  Furthermore, I acknowledge that it is my responsibility to provide information about my medical history, conditions and care that is complete and accurate to the best of my ability. I acknowledge that Practitioner's advice, recommendations, and/or decision may be based on factors not within their control, such as incomplete or inaccurate data provided by me or distortions of diagnostic images or specimens that may result from electronic transmissions. I understand that the practice of medicine is not an exact science and that Practitioner makes no warranties or guarantees regarding treatment outcomes. I acknowledge that a copy of this consent can be made available to me via my patient portal [facility] MyChart), or I can request a printed copy by calling the office of [facility].    I understand that my insurance will be billed for this visit.   I have read or had this consent read to me. I understand the contents of this consent, which adequately explains the benefits and risks of the Services being provided via telemedicine.  I have been provided ample opportunity to ask questions regarding this consent and the Services and have had my questions answered to my satisfaction. I give my informed consent for the services to be provided through the use of telemedicine in my medical care

## 2023-10-26 ENCOUNTER — Ambulatory Visit: Attending: Cardiology | Admitting: Emergency Medicine

## 2023-10-26 DIAGNOSIS — Z01810 Encounter for preprocedural cardiovascular examination: Secondary | ICD-10-CM

## 2023-10-26 NOTE — Progress Notes (Signed)
 Virtual Visit via Telephone Note   Because of AMMI HUTT co-morbid illnesses, she is at least at moderate risk for complications without adequate follow up.  This format is felt to be most appropriate for this patient at this time.  Due to technical limitations with video connection Web designer), today's appointment will be conducted as an audio only telehealth visit, and [name] verbally agreed to proceed in this manner.   All issues noted in this document were discussed and addressed.  No physical exam could be performed with this format.  Evaluation Performed:  Preoperative cardiovascular risk assessment _____________   Date:  10/26/2023   Patient ID:  Katrina Oliver, DOB 1946/03/07, MRN [id number] Patient Location:  Home Provider location:   Office  Primary Care Provider:  Douglass Ivanoff, MD Primary Cardiologist:  None  Chief Complaint / Patient Profile   78 y.o. y/o female with a h/o hypertension, aortic atherosclerosis, hyperlipidemia, and atrial fibrillation who is pending colonoscopy with [facility] on 11/10/2023 and presents today for telephonic preoperative cardiovascular risk assessment.  History of Present Illness    Katrina Oliver is a 78 y.o. female who presents via audio/video conferencing for a telehealth visit today.  Pt was last seen in cardiology clinic on 04/06/2023 by Clint, PA with atrial fibrillation clinic.  At that time Katrina Oliver was doing well.  The patient is now pending procedure as outlined above. Since her last visit, she denies chest pain, shortness of breath, lower extremity edema, fatigue, palpitations, hemoptysis, diaphoresis, weakness, presyncope, syncope, orthopnea, and PND.  Today patient is doing well overall.  She is without acute cardiovascular concerns or complaints at this time.  She reports only 2 episodes of atrial fibrillation over the past year.  Both episodes were resolved with her as needed metoprolol .  She  reports no symptoms recently or today concerning for recurrent atrial fibrillation.  She reports her blood pressure at home has been stable.  She maintains an active lifestyle.  She goes to the [facility] at least 3 times a week and other days she attends workout classes or walks.  Overall she is very active without anginal symptoms or limitations.  She is able to complete greater than 4 METS.  Past Medical History    Past Medical History:  Diagnosis Date   A-fib [facility])    Aortic atherosclerosis (HCC)    Dysrhythmia    Hip pain    HTN (hypertension)    Hypercholesteremia    Osteoarthritis    PAF (paroxysmal atrial fibrillation) (HCC)    Past Surgical History:  Procedure Laterality Date   RIGHT OOPHORECTOMY  2006   TOTAL HIP ARTHROPLASTY Right 07/29/2021   Procedure: TOTAL HIP ARTHROPLASTY ANTERIOR APPROACH;  Surgeon: Katrina Rogue, MD;  Location: [facility] ORS;  Service: Orthopedics;  Laterality: Right;  150   TUBAL LIGATION  1982    Allergies  Allergies  Allergen Reactions   Other Other (See Comments)    alloway eye drops   Alaway [Ketotifen Fumarate]     Eye drops - causes eye irritation, redness and swelling    Pollen Extract     Trees, Grass    Home Medications    Prior to Admission medications   Medication Sig Start Date End Date Taking? Authorizing Provider  acetaminophen  (TYLENOL ) 500 MG tablet Take one as needed as directed Indications: pain    [provider]  apixaban  (ELIQUIS ) 5 MG TABS tablet Take one tablet by mouth twice daily 05/23/23  Terra Fairy PARAS, PA-C  Calcium  Carbonate-Vitamin D  600-3.125 MG-MCG TABS Take 1 tablet by mouth daily.    [provider]  cetirizine (ZYRTEC) 10 MG tablet Take 10 mg by mouth daily.    [provider]  Estradiol 10 MCG TABS vaginal tablet Place 1 tablet vaginally 2 (two) times a week.    [provider]  fluticasone  (FLONASE ) 50 MCG/ACT nasal spray Place 1 spray into both nostrils as needed for  allergies or rhinitis.    [provider]  Anselm Oil 350 MG CAPS Take 350 mg by mouth daily.    [provider]  Magnesium  250 MG TABS Take 250 mg by mouth daily.    [provider]  metoprolol  tartrate (LOPRESSOR ) 25 MG tablet TAKE 1 TABLET TWICE A DAY 05/23/23   Terra Fairy PARAS, PA-C  metroNIDAZOLE (METROGEL) 0.75 % gel Apply 1 application. topically daily.    [provider]  Multiple Vitamin (MULTIVITAMIN) tablet Take 1 tablet by mouth daily.    [provider]  Multiple Vitamins-Minerals (VITAMIN-MINERAL SUPPLEMENT PO) Name: Advanced Collagen: Take one every day for supplement.    [provider]  Probiotic Product ([employer]S DAILY PROBIOTIC PO) Take 1 capsule by mouth daily. 04/27/19   [provider]  Propylene Glycol (SYSTANE COMPLETE) 0.6 % SOLN Place 1 drop into both eyes 2 (two) times daily as needed (dry eyes).    [provider]  rosuvastatin  (CRESTOR ) 10 MG tablet Take 10 mg by mouth at bedtime. 04/01/21   [provider]    Physical Exam    Vital Signs:  Katrina Oliver does not have vital signs available for review today.  Given telephonic nature of communication, physical exam is limited. AAOx3. NAD. Normal affect.  Speech and respirations are unlabored.  Accessory Clinical Findings    None  Assessment & Plan    1.  Preoperative Cardiovascular Risk Assessment: According to the Revised Cardiac Risk Index (RCRI), her Perioperative Risk of Major Cardiac Event is (%): 0.4. Her Functional Capacity in METs is: 5.07 according to the [facility] Activity Status Index (DASI). Therefore, based on ACC/AHA guidelines, patient would be at acceptable risk for the planned procedure without further cardiovascular testing. I will route this recommendation to the requesting party via Epic fax function.  The patient was advised that if she develops new symptoms prior to surgery to contact our office to arrange for a  follow-up visit, and she verbalized understanding.  Per office protocol, patient can hold Eliquis  for 2 days prior to procedure.    A copy of this note will be routed to requesting surgeon.  Time:   Today, I have spent 7 minutes with the patient with telehealth technology discussing medical history, symptoms, and management plan.     Lum LITTIE Louis, NP  10/26/2023, 3:11 PM

## 2024-04-05 ENCOUNTER — Ambulatory Visit (HOSPITAL_COMMUNITY)
Admission: RE | Admit: 2024-04-05 | Discharge: 2024-04-05 | Attending: Physician Assistant | Admitting: Physician Assistant

## 2024-04-05 VITALS — BP 162/98 | HR 75 | Ht 62.5 in | Wt 134.2 lb

## 2024-04-05 DIAGNOSIS — I4891 Unspecified atrial fibrillation: Secondary | ICD-10-CM | POA: Diagnosis not present

## 2024-04-05 DIAGNOSIS — D6869 Other thrombophilia: Secondary | ICD-10-CM | POA: Diagnosis not present

## 2024-04-05 DIAGNOSIS — I480 Paroxysmal atrial fibrillation: Secondary | ICD-10-CM | POA: Diagnosis not present

## 2024-04-05 DIAGNOSIS — I10 Essential (primary) hypertension: Secondary | ICD-10-CM | POA: Diagnosis not present

## 2024-04-05 NOTE — Progress Notes (Signed)
 Primary Care Physician: Dr. Arland Bolder Referring Physician: Same  Primary Cardiologist: Dr Wonda Arlyne [name] is a 78 y.o. female with a h/o HTN, aortic atherosclerosis, HLD, and atrial fibrillation who presents for follow up in the [facility]. Patient is on Eliquis  for stroke prevention.  Patient returns for follow up for atrial fibrillation. She reports she has done well since her last visit. She had 4 episodes of tachypalpitations lasting roughly 20 minutes each. She takes an extra 1/2 pill of BB when these occur. There does not appear to be any specific trigger. No bleeding issues on anticoagulation.   Today, she  denies symptoms of chest pain, shortness of breath, orthopnea, PND, lower extremity edema, dizziness, presyncope, syncope, snoring, daytime somnolence, bleeding, or neurologic sequela. The patient is tolerating medications without difficulties and is otherwise without complaint today.    Past Medical History:  Diagnosis Date   A-fib [facility])    Aortic atherosclerosis    Dysrhythmia    Hip pain    HTN (hypertension)    Hypercholesteremia    Osteoarthritis    PAF (paroxysmal atrial fibrillation) (HCC)      Current Outpatient Medications  Medication Sig Dispense Refill   acetaminophen  (TYLENOL ) 500 MG tablet Take one as needed as directed Indications: pain     apixaban  (ELIQUIS ) 5 MG TABS tablet Take one tablet by mouth twice daily 180 tablet 3   Calcium  Carbonate-Vitamin D  600-3.125 MG-MCG TABS Take 1 tablet by mouth daily.     cetirizine (ZYRTEC) 10 MG tablet Take 10 mg by mouth daily. (Patient taking differently: Take 10 mg by mouth as needed.)     Estradiol 10 MCG TABS vaginal tablet Place 1 tablet vaginally 2 (two) times a week.     fluticasone  (FLONASE ) 50 MCG/ACT nasal spray Place 1 spray into both nostrils as needed for allergies or rhinitis.     Magnesium  250 MG TABS Take 250 mg by mouth daily.     metoprolol   tartrate (LOPRESSOR ) 25 MG tablet TAKE 1 TABLET TWICE A DAY 180 tablet 3   metroNIDAZOLE (METROGEL) 0.75 % gel Apply 1 application. topically daily.     Multiple Vitamin (MULTIVITAMIN) tablet Take 1 tablet by mouth daily.     Multiple Vitamins-Minerals (VITAMIN-MINERAL SUPPLEMENT PO) Name: Advanced Collagen: Take one every day for supplement.     Probiotic Product (CVS DAILY PROBIOTIC PO) Take 1 capsule by mouth daily.     Propylene Glycol (SYSTANE COMPLETE) 0.6 % SOLN Place 1 drop into both eyes 2 (two) times daily as needed (dry eyes).     rosuvastatin  (CRESTOR ) 10 MG tablet Take 10 mg by mouth at bedtime.     No current facility-administered medications for this encounter.    ROS- All systems are reviewed and negative except as per the HPI above  Physical Exam: Vitals:   04/05/24 0934 04/05/24 1016  BP: (!) 202/104 (!) 162/98  Pulse: 75   Weight: 60.9 kg   Height: 5' 2.5 (1.588 m)     GEN: Well nourished, well developed in no acute distress CARDIAC: Regular rate and rhythm, no murmurs, rubs, gallops RESPIRATORY:  Clear to auscultation without rales, wheezing or rhonchi  ABDOMEN: Soft, non-tender, non-distended EXTREMITIES:  No edema; No deformity    EKG Interpretation Date/Time:  Thursday April 05 2024 09:47:36 EST Ventricular Rate:  75 PR Interval:  136 QRS Duration:  70 QT Interval:  400 QTC Calculation: 446 R Axis:  100  Text Interpretation: Normal sinus rhythm Rightward axis Borderline ECG When compared with ECG of 06-Apr-2023 10:36, No significant change was found Confirmed by Ignatius Kloos ([id number]) on 04/05/2024 10:22:20 AM    Echo 2017 - Left ventricle: The cavity size was normal. Systolic function was   normal. The estimated ejection fraction was in the range of 55%   to 60%. Wall motion was normal; there were no regional wall   motion abnormalities. Left ventricular diastolic function   parameters were normal. - Mitral valve: There was mild  regurgitation.   CHA2DS2-VASc Score = 5  The patient's score is based upon: CHF History: 0 HTN History: 1 Diabetes History: 0 Stroke History: 0 Vascular Disease History: 1 (aortic atherosclerosis) Age Score: 2 Gender Score: 1       ASSESSMENT AND PLAN: Paroxysmal Atrial Fibrillation (ICD10:  I48.0) The patient's CHA2DS2-VASc score is 5, indicating a 7.2% annual risk of stroke.   Patient appears to be maintaining SR with rare, brief episodes. Continue Lopressor  25 mg BID with extra 12.5 mg for heart racing.  Continue Eliquis  5 mg BID  Secondary Hypercoagulable State (ICD10:  D68.69) The patient is at significant risk for stroke/thromboembolism based upon her CHA2DS2-VASc Score of 5.  Continue Apixaban  (Eliquis ). No bleeding issues.   HTN Very elevated initially, better on recheck. Known h/o white coat HTN. She checks her BP daily at home and log shows systolic readings 110s-120s.  No changes today.    Follow up in the AF clinic in one year.    Daril Kicks PA-C Afib Clinic [facility] [address] [place], [place] [address] [phone]

## 2024-04-07 ENCOUNTER — Inpatient Hospital Stay (HOSPITAL_COMMUNITY)
Admission: EM | Admit: 2024-04-07 | Discharge: 2024-04-10 | DRG: 200 | Disposition: A | Source: Home / Self Care | Attending: Internal Medicine | Admitting: Internal Medicine

## 2024-04-07 ENCOUNTER — Emergency Department (HOSPITAL_COMMUNITY)

## 2024-04-07 ENCOUNTER — Other Ambulatory Visit: Payer: Self-pay

## 2024-04-07 ENCOUNTER — Other Ambulatory Visit (HOSPITAL_COMMUNITY): Payer: Self-pay | Admitting: Internal Medicine

## 2024-04-07 DIAGNOSIS — S272XXA Traumatic hemopneumothorax, initial encounter: Secondary | ICD-10-CM | POA: Diagnosis present

## 2024-04-07 DIAGNOSIS — Y92009 Unspecified place in unspecified non-institutional (private) residence as the place of occurrence of the external cause: Secondary | ICD-10-CM | POA: Diagnosis not present

## 2024-04-07 DIAGNOSIS — Z79890 Hormone replacement therapy (postmenopausal): Secondary | ICD-10-CM | POA: Diagnosis not present

## 2024-04-07 DIAGNOSIS — S2242XA Multiple fractures of ribs, left side, initial encounter for closed fracture: Secondary | ICD-10-CM

## 2024-04-07 DIAGNOSIS — Z90721 Acquired absence of ovaries, unilateral: Secondary | ICD-10-CM | POA: Diagnosis not present

## 2024-04-07 DIAGNOSIS — J939 Pneumothorax, unspecified: Principal | ICD-10-CM

## 2024-04-07 DIAGNOSIS — I10 Essential (primary) hypertension: Secondary | ICD-10-CM | POA: Diagnosis present

## 2024-04-07 DIAGNOSIS — J942 Hemothorax: Secondary | ICD-10-CM | POA: Diagnosis not present

## 2024-04-07 DIAGNOSIS — Z96641 Presence of right artificial hip joint: Secondary | ICD-10-CM | POA: Diagnosis present

## 2024-04-07 DIAGNOSIS — R0602 Shortness of breath: Secondary | ICD-10-CM | POA: Diagnosis present

## 2024-04-07 DIAGNOSIS — E7800 Pure hypercholesterolemia, unspecified: Secondary | ICD-10-CM | POA: Diagnosis present

## 2024-04-07 DIAGNOSIS — Z79899 Other long term (current) drug therapy: Secondary | ICD-10-CM | POA: Diagnosis not present

## 2024-04-07 DIAGNOSIS — I480 Paroxysmal atrial fibrillation: Secondary | ICD-10-CM | POA: Diagnosis present

## 2024-04-07 DIAGNOSIS — W010XXA Fall on same level from slipping, tripping and stumbling without subsequent striking against object, initial encounter: Secondary | ICD-10-CM | POA: Diagnosis present

## 2024-04-07 DIAGNOSIS — Z7901 Long term (current) use of anticoagulants: Secondary | ICD-10-CM | POA: Diagnosis not present

## 2024-04-07 DIAGNOSIS — Z888 Allergy status to other drugs, medicaments and biological substances status: Secondary | ICD-10-CM | POA: Diagnosis not present

## 2024-04-07 DIAGNOSIS — Y9301 Activity, walking, marching and hiking: Secondary | ICD-10-CM | POA: Diagnosis present

## 2024-04-07 DIAGNOSIS — Z87891 Personal history of nicotine dependence: Secondary | ICD-10-CM | POA: Diagnosis not present

## 2024-04-07 DIAGNOSIS — I482 Chronic atrial fibrillation, unspecified: Secondary | ICD-10-CM | POA: Diagnosis present

## 2024-04-07 LAB — COMPREHENSIVE METABOLIC PANEL WITH GFR
ALT: 15 U/L (ref 0–44)
AST: 25 U/L (ref 15–41)
Albumin: 4.4 g/dL (ref 3.5–5.0)
Alkaline Phosphatase: 93 U/L (ref 38–126)
Anion gap: 12 (ref 5–15)
BUN: 19 mg/dL (ref 8–23)
CO2: 27 mmol/L (ref 22–32)
Calcium: 9.6 mg/dL (ref 8.9–10.3)
Chloride: 101 mmol/L (ref 98–111)
Creatinine, Ser: 0.66 mg/dL (ref 0.44–1.00)
GFR, Estimated: >60 mL/min (ref >60)
Glucose, Bld: 112 mg/dL — ABNORMAL HIGH (ref 70–99)
Potassium: 3.9 mmol/L (ref 3.5–5.1)
Sodium: 140 mmol/L (ref 135–145)
Total Bilirubin: 0.7 mg/dL (ref 0.0–1.2)
Total Protein: 7.2 g/dL (ref 6.5–8.1)

## 2024-04-07 LAB — I-STAT CHEM 8, ED
BUN: 27 mg/dL — ABNORMAL HIGH (ref 8–23)
Calcium, Ion: 1.08 mmol/L — ABNORMAL LOW (ref 1.15–1.40)
Chloride: 101 mmol/L (ref 98–111)
Creatinine, Ser: 0.6 mg/dL (ref 0.44–1.00)
Glucose, Bld: 114 mg/dL — ABNORMAL HIGH (ref 70–99)
HCT: 47 % — ABNORMAL HIGH (ref 36.0–46.0)
Hemoglobin: 16 g/dL — ABNORMAL HIGH (ref 12.0–15.0)
Potassium: 4.5 mmol/L (ref 3.5–5.1)
Sodium: 138 mmol/L (ref 135–145)
TCO2: 27 mmol/L (ref 22–32)

## 2024-04-07 LAB — SAMPLE TO BLOOD BANK

## 2024-04-07 LAB — CBC
HCT: 46.2 % — ABNORMAL HIGH (ref 36.0–46.0)
Hemoglobin: 15.1 g/dL — ABNORMAL HIGH (ref 12.0–15.0)
MCH: 33.6 pg (ref 26.0–34.0)
MCHC: 32.7 g/dL (ref 30.0–36.0)
MCV: 102.7 fL — ABNORMAL HIGH (ref 80.0–100.0)
Platelets: 181 K/uL (ref 150–400)
RBC: 4.5 MIL/uL (ref 3.87–5.11)
RDW: 13.2 % (ref 11.5–15.5)
WBC: 6.7 K/uL (ref 4.0–10.5)
nRBC: 0 % (ref 0.0–0.2)

## 2024-04-07 LAB — URINALYSIS, ROUTINE W REFLEX MICROSCOPIC
Bilirubin Urine: NEGATIVE
Glucose, UA: NEGATIVE mg/dL
Ketones, ur: 80 mg/dL — AB
Nitrite: NEGATIVE
Protein, ur: NEGATIVE mg/dL
Specific Gravity, Urine: 1.024 (ref 1.005–1.030)
pH: 5 (ref 5.0–8.0)

## 2024-04-07 LAB — PROTIME-INR
INR: 1 (ref 0.8–1.2)
Prothrombin Time: 13.6 s (ref 11.4–15.2)

## 2024-04-07 LAB — I-STAT CG4 LACTIC ACID, ED: Lactic Acid, Venous: 1.3 mmol/L (ref 0.5–1.9)

## 2024-04-07 MED ORDER — FENTANYL CITRATE (PF) 50 MCG/ML IJ SOSY: 12.5000 ug | PREFILLED_SYRINGE | INTRAMUSCULAR | Status: DC | PRN

## 2024-04-07 MED ORDER — HYDROCODONE-ACETAMINOPHEN 5-325 MG PO TABS: 1.0000 | ORAL_TABLET | ORAL | Status: DC | PRN

## 2024-04-07 MED ORDER — ALBUTEROL SULFATE (2.5 MG/3ML) 0.083% IN NEBU: 2.5000 mg | INHALATION_SOLUTION | RESPIRATORY_TRACT | Status: DC | PRN

## 2024-04-07 MED ORDER — LIDOCAINE 5 % EX PTCH
1.0000 | MEDICATED_PATCH | Freq: Once | CUTANEOUS | Status: AC
  Administered 2024-04-07: 1 via TRANSDERMAL
  Filled 2024-04-07: qty 1

## 2024-04-07 MED ORDER — OXYCODONE-ACETAMINOPHEN 5-325 MG PO TABS
1.0000 | ORAL_TABLET | Freq: Once | ORAL | Status: AC
  Administered 2024-04-07: 1 via ORAL
  Filled 2024-04-07: qty 1

## 2024-04-07 MED ORDER — IOHEXOL 300 MG/ML  SOLN
100.0000 mL | Freq: Once | INTRAMUSCULAR | Status: AC | PRN
  Administered 2024-04-07: 100 mL via INTRAVENOUS

## 2024-04-07 MED ORDER — METOPROLOL TARTRATE 25 MG PO TABS
25.0000 mg | ORAL_TABLET | Freq: Once | ORAL | Status: AC
  Administered 2024-04-07: 25 mg via ORAL
  Filled 2024-04-07: qty 1

## 2024-04-07 MED ORDER — TRAMADOL HCL 50 MG PO TABS
50.0000 mg | ORAL_TABLET | Freq: Three times a day (TID) | ORAL | Status: DC | PRN
  Administered 2024-04-07: 50 mg via ORAL
  Filled 2024-04-07: qty 1

## 2024-04-07 NOTE — H&P (Incomplete)
 History and Physical    Katrina Oliver FMW:[id number] DOB: 05-20-1945 DOA: 04/07/2024  PCP: Chrystal Lamarr RAMAN, MD  Patient coming from: home  I have personally briefly reviewed patient's old medical records in [facility]  Chief Complaint: Left sided chest pain  HPI: Katrina Oliver is a 78 y.o. female with medical history significant of Atrial fibrillation on Eliquis ,HTN, HLD, who presents to ED with complaint of left sided chest pain. Patient has interim history of fall the weekend of Thanksgiving with with trauma to left side. Patient was seen for her injury and cxr was completed which was unremarkable and patient was sent home from Urgent cared with supportive measures.  Patient however has persistent symptoms and presented to pcp clinic and was found to have fx ribs/pneumothorax and hemothorax on repeat imaging. Due to this patient was referred to ED for further evaluation.   ED Course:  Vitals:  Review of Systems: As per HPI otherwise 10 point review of systems negative.   Past Medical History:  Diagnosis Date   A-fib [facility])    Aortic atherosclerosis    Dysrhythmia    Hip pain    HTN (hypertension)    Hypercholesteremia    Osteoarthritis    PAF (paroxysmal atrial fibrillation) (HCC)     Past Surgical History:  Procedure Laterality Date   RIGHT OOPHORECTOMY  2006   TOTAL HIP ARTHROPLASTY Right 07/29/2021   Procedure: TOTAL HIP ARTHROPLASTY ANTERIOR APPROACH;  Surgeon: Fidel Rogue, MD;  Location: [facility]S;  Service: Orthopedics;  Laterality: Right;  150   TUBAL LIGATION  1982     reports that she quit smoking about 19 years ago. Her smoking use included cigarettes. She started smoking about 49 years ago. She has a 7.5 pack-year smoking history. She has never used smokeless tobacco. She reports current alcohol  use of about 12.0 standard drinks of alcohol  per week. She reports that she does not use drugs.  Allergies[1]  No family history on  file. *** Prior to Admission medications  Medication Sig Start Date End Date Taking? Authorizing Provider  acetaminophen  (TYLENOL ) 500 MG tablet Take one as needed as directed Indications: pain    [provider]  apixaban  (ELIQUIS ) 5 MG TABS tablet Take one tablet by mouth twice daily 05/23/23   Terra Fairy PARAS, PA-C  Calcium  Carbonate-Vitamin D  600-3.125 MG-MCG TABS Take 1 tablet by mouth daily.    [provider]  cetirizine (ZYRTEC) 10 MG tablet Take 10 mg by mouth daily. Patient taking differently: Take 10 mg by mouth as needed.    [provider]  Estradiol 10 MCG TABS vaginal tablet Place 1 tablet vaginally 2 (two) times a week.    [provider]  fluticasone  (FLONASE ) 50 MCG/ACT nasal spray Place 1 spray into both nostrils as needed for allergies or rhinitis.    [provider]  Magnesium  250 MG TABS Take 250 mg by mouth daily.    [provider]  metoprolol  tartrate (LOPRESSOR ) 25 MG tablet TAKE 1 TABLET TWICE A DAY 05/23/23   Terra Fairy PARAS, PA-C  metroNIDAZOLE (METROGEL) 0.75 % gel Apply 1 application. topically daily.    [provider]  Multiple Vitamin (MULTIVITAMIN) tablet Take 1 tablet by mouth daily.    [provider]  Multiple Vitamins-Minerals (VITAMIN-MINERAL SUPPLEMENT PO) Name: Advanced Collagen: Take one every day for supplement.    [provider]  Probiotic Product ([employer] DAILY PROBIOTIC PO) Take 1 capsule by mouth daily. 04/27/19  [provider]  Propylene Glycol (SYSTANE COMPLETE) 0.6 % SOLN Place 1 drop into both eyes 2 (two) times daily as needed (dry eyes).    [provider]  rosuvastatin  (CRESTOR ) 10 MG tablet Take 10 mg by mouth at bedtime. 04/01/21   [provider]    Physical Exam: Vitals:   04/07/24 1700 04/07/24 1730 04/07/24 1830 04/07/24 1833  BP: (!) 221/80 (!) 181/73 (!) 165/69   Pulse: (!) 104 74 69 66  Resp: 18 18 18    Temp:    98.6 F  (37 C)  TempSrc:    Oral  SpO2: 97% 100% 100% 100%    Constitutional: NAD, calm, comfortable Vitals:   04/07/24 1700 04/07/24 1730 04/07/24 1830 04/07/24 1833  BP: (!) 221/80 (!) 181/73 (!) 165/69   Pulse: (!) 104 74 69 66  Resp: 18 18 18    Temp:    98.6 F (37 C)  TempSrc:    Oral  SpO2: 97% 100% 100% 100%   Eyes: PERRL, lids and conjunctivae normal ENMT: Mucous membranes are moist. Posterior pharynx clear of any exudate or lesions.Normal dentition.  Neck: normal, supple, no masses, no thyromegaly Respiratory: clear to auscultation bilaterally, no wheezing, no crackles. Normal respiratory effort. No accessory muscle use.  Cardiovascular: Regular rate and rhythm, no murmurs / rubs / gallops. No extremity edema. 2+ pedal pulses. No carotid bruits.  Abdomen: no tenderness, no masses palpated. No hepatosplenomegaly. Bowel sounds positive.  Musculoskeletal: no clubbing / cyanosis. No joint deformity upper and lower extremities. Good ROM, no contractures. Normal muscle tone.  Skin: no rashes, lesions, ulcers. No induration Neurologic: CN 2-12 grossly intact. Sensation intact, DTR normal. Strength 5/5 in all 4.  Psychiatric: Normal judgment and insight. Alert and oriented x 3. Normal mood.    Labs on Admission: I have personally reviewed following labs and imaging studies  CBC: Recent Labs  Lab 04/07/24 1525 04/07/24 1537  WBC 6.7  --   HGB 15.1* 16.0*  HCT 46.2* 47.0*  MCV 102.7*  --   PLT 181  --    Basic Metabolic Panel: Recent Labs  Lab 04/07/24 1525 04/07/24 1537  NA 140 138  K 3.9 4.5  CL 101 101  CO2 27  --   GLUCOSE 112* 114*  BUN 19 27*  CREATININE 0.66 0.60  CALCIUM  9.6  --    GFR: Estimated Creatinine Clearance: 46.2 mL/min (by C-G formula based on SCr of 0.6 mg/dL). Liver Function Tests: Recent Labs  Lab 04/07/24 1525  AST 25  ALT 15  ALKPHOS 93  BILITOT 0.7  PROT 7.2  ALBUMIN 4.4   No results for input(s): LIPASE, AMYLASE in the last 168  hours. No results for input(s): AMMONIA in the last 168 hours. Coagulation Profile: Recent Labs  Lab 04/07/24 1525  INR 1.0   Cardiac Enzymes: No results for input(s): CKTOTAL, CKMB, CKMBINDEX, TROPONINI in the last 168 hours. BNP (last 3 results) No results for input(s): PROBNP in the last 8760 hours. HbA1C: No results for input(s): HGBA1C in the last 72 hours. CBG: No results for input(s): GLUCAP in the last 168 hours. Lipid Profile: No results for input(s): CHOL, HDL, LDLCALC, TRIG, CHOLHDL, LDLDIRECT in the last 72 hours. Thyroid Function Tests: No results for input(s): TSH, T4TOTAL, FREET4, T3FREE, THYROIDAB in the last 72 hours. Anemia Panel: No results for input(s): VITAMINB12, FOLATE, FERRITIN, TIBC, IRON, RETICCTPCT in the last 72 hours. Urine analysis:    Component Value Date/Time   COLORURINE YELLOW  04/07/2024 1629   APPEARANCEUR CLEAR 04/07/2024 1629   LABSPEC 1.024 04/07/2024 1629   PHURINE 5.0 04/07/2024 1629   GLUCOSEU NEGATIVE 04/07/2024 1629   HGBUR SMALL (A) 04/07/2024 1629   BILIRUBINUR NEGATIVE 04/07/2024 1629   KETONESUR 80 (A) 04/07/2024 1629   PROTEINUR NEGATIVE 04/07/2024 1629   NITRITE NEGATIVE 04/07/2024 1629   LEUKOCYTESUR TRACE (A) 04/07/2024 1629    Radiological Exams on Admission: CT CHEST ABDOMEN PELVIS W CONTRAST Result Date: 04/07/2024 CLINICAL DATA:  Patient is status post fall with known multiple rib fractures and LEFT pneumothorax EXAM: CT CHEST, ABDOMEN, AND PELVIS WITH CONTRAST TECHNIQUE: Multidetector CT imaging of the chest, abdomen and pelvis was performed following the standard protocol during bolus administration of intravenous contrast. RADIATION DOSE REDUCTION: This exam was performed according to the departmental dose-optimization program which includes automated exposure control, adjustment of the mA and/or kV according to patient size and/or use of iterative reconstruction  technique. CONTRAST:  OMNIPAQUE  IOHEXOL  300 MG/ML  SOLN COMPARISON:  Same day radiograph.  December 15, 2019 FINDINGS: CT CHEST FINDINGS Cardiovascular: Heart is normal in size. No pericardial effusion. Atherosclerotic calcifications of the nonaneurysmal abdominal aorta. Mediastinum/Nodes: Visualized thyroid is unremarkable. No axillary or mediastinal adenopathy. Lungs/Pleura: There is a small to moderate LEFT-sided pneumothorax. There is a small to moderate pleural effusion which measures slightly higher density than simple fluid and likely reflects a degree of hemothorax. Moderate centrilobular emphysema. Near complete atelectasis of the lingula and LEFT lower lobe. Musculoskeletal: There are multiple LEFT-sided rib fractures involving the anterolateral fifth through eighth ribs. There is adjacent periosteal reaction suggesting subacute nature. There are multiple fractures of the sixth rib laterally. There is an additional nondisplaced fracture of the of the posterior seventh and eighth ribs. There is a comminuted fracture involving the posterolateral ninth rib with adjacent periosteal reaction. There are at least 3 fractures of the eighth rib. CT ABDOMEN PELVIS FINDINGS Hepatobiliary: Low-density of the liver diffusely. Gallbladder is unremarkable. Subcentimeter hypodense lesions are too small to accurately characterize. Pancreas: Unremarkable. No pancreatic ductal dilatation or surrounding inflammatory changes. Spleen: Normal in size without focal abnormality. Adrenals/Urinary Tract: Adrenal glands are unremarkable. Kidneys enhance symmetrically. No hydronephrosis. No obstructing nephrolithiasis. Bladder is obscured by metallic streak artifact. Stomach/Bowel: No evidence of bowel obstruction. Appendiceal candidate is unremarkable. No ancillary evidence of appendicitis. Small hiatal hernia. Vascular/Lymphatic: Sclerotic calcifications of the nonaneurysmal abdominal aorta. No suspicious lymphadenopathy  visualized. Circumaortic LEFT renal vein. Reproductive: Pelvis is obscured by streak artifact. Other: No free air. Musculoskeletal: Status post RIGHT hip arthroplasty. Predominately facet arthropathy of the lumbar spine. No acute fracture or static subluxation of the lumbar spine. IMPRESSION: 1. There are multiple subacute appearing LEFT-sided rib fractures involving the fifth through ninth ribs, several which are fractured in multiple locations. There are at least 3 fractures involving the eighth rib. 2. There is a small to moderate LEFT-sided pneumothorax. There is a small to moderate LEFT pleural effusion which measures slightly higher density than simple fluid and likely reflects a degree of hemothorax. 3. Near complete atelectasis of the lingula and LEFT lower lobe. 4. No acute traumatic injury within the abdomen or pelvis. 5. Favored underlying hepatic steatosis. Aortic Atherosclerosis (ICD10-I70.0) and Emphysema (ICD10-J43.9). Electronically Signed   By: Corean Salter M.D.   On: 04/07/2024 17:28   CT CERVICAL SPINE WO CONTRAST Result Date: 04/07/2024 EXAM: CT CERVICAL SPINE WITHOUT CONTRAST 04/07/2024 04:51:22 PM TECHNIQUE: CT of the cervical spine was performed without the administration of intravenous contrast. Multiplanar  reformatted images are provided for review. Automated exposure control, iterative reconstruction, and/or weight based adjustment of the mA/kV was utilized to reduce the radiation dose to as low as reasonably achievable. COMPARISON: None available. CLINICAL HISTORY: Polytrauma, blunt. Fall before Thanksgiving. FINDINGS: BONES AND ALIGNMENT: Normal alignment. No acute fracture or suspicious lesion. DEGENERATIVE CHANGES: Moderate atlantodental degenerative changes. Mild cervical spondylosis and moderately advanced facet arthrosis. No evidence of high grade spinal canal stenosis. SOFT TISSUES: No prevertebral soft tissue swelling. The chest is reported separately. IMPRESSION: 1. No  acute cervical spine fracture or traumatic malalignment. Electronically signed by: Dasie Hamburg MD 04/07/2024 05:03 PM EST RP Workstation: [id number]   CT HEAD WO CONTRAST Result Date: 04/07/2024 EXAM: CT HEAD WITHOUT 04/07/2024 04:51:22 PM TECHNIQUE: CT of the head was performed without the administration of intravenous contrast. Automated exposure control, iterative reconstruction, and/or weight based adjustment of the mA/kV was utilized to reduce the radiation dose to as low as reasonably achievable. COMPARISON: None available. CLINICAL HISTORY: Head trauma, moderate-severe. Fall before thanksgiving. FINDINGS: BRAIN AND VENTRICLES: There is no evidence of an acute infarct, intracranial hemorrhage, mass, midline shift, hydrocephalus, or extra-axial fluid collection. There is mild generalized cerebral atrophy. Cerebral white matter hypodensities are nonspecific but compatible with mild chronic small vessel ischemic disease. ORBITS: Bilateral cataract extraction. SINUSES AND MASTOIDS: Mucosal thickening in the left sphenoid sinus. Clear mastoid air cells. SOFT TISSUES AND SKULL: No acute skull fracture. No acute soft tissue abnormality. IMPRESSION: 1. No acute intracranial abnormality. 2. Mild chronic small vessel ischemic disease. Electronically signed by: Dasie Hamburg MD 04/07/2024 04:59 PM EST RP Workstation: [id number]    EKG: Independently reviewed. ***  Assessment/Plan Active Problems:   * No active hospital problems. *   ***  DVT prophylaxis: *** (Lovenox/Heparin/SCD's/anticoagulated/None (if comfort care) Code Status: *** (Full/Partial (specify details) Family Communication: *** (Specify name, relationship. Do not write discussed with patient. Specify tel # if discussed over the phone) Disposition Plan: *** (specify when and where you expect patient to be discharged) Consults called: *** (with names) Admission status: *** (inpatient / obs / tele / medical floor / SDU)   Camila DELENA Ned MD [facility] Pager [phone]  If 7PM-7AM, please contact night-coverage [email or web address] Password [email or web address]  04/07/2024, 9:46 PM          [1] Allergies Allergen Reactions   Other Other (See Comments)    alloway eye drops   Alaway [Ketotifen Fumarate]     Eye drops - causes eye irritation, redness and swelling    Pollen Extract     Trees, Grass

## 2024-04-07 NOTE — ED Provider Notes (Signed)
 [name] EMERGENCY DEPARTMENT AT [facility] Provider Note   CSN: [id number] Arrival date & time: 04/07/24  1321     Patient presents with: Rib Injury and Chest Injury   Katrina Oliver is a 78 y.o. female.   Patient is a 78 year old female with a past medical history of A-fib on Eliquis  presenting to the emergency department left-sided rib injury.  Patient states that just after Thanksgiving she got up in the night to go to the bathroom and had the lights off in the house which caused her to trip and fall onto her left side.  States that she did hit her head but did not lose consciousness.  She states that she initially went to urgent care the next morning and had rib x-rays that did not show any fractures.  She states that last night her left-sided pain significantly worsened so she went back to her primary care walk-in clinic today who got a repeat x-ray that now showed 3 rib fractures and a pneumothorax and they recommended that she come to the emergency department.  She states that she has been having some shortness of breath and pain with taking deep breaths.  She states that she has had occasional headaches that come and go but attributes this to her sinuses.  She denies any nausea, vomiting, numbness or weakness.  She denies any other pain or injuries from the fall.  The history is provided by the patient and the spouse.       Prior to Admission medications  Medication Sig Start Date End Date Taking? Authorizing Provider  acetaminophen  (TYLENOL ) 500 MG tablet Take one as needed as directed Indications: pain    [provider]  apixaban  (ELIQUIS ) 5 MG TABS tablet Take one tablet by mouth twice daily 05/23/23   Terra Fairy PARAS, PA-C  Calcium  Carbonate-Vitamin D  600-3.125 MG-MCG TABS Take 1 tablet by mouth daily.    [provider]  cetirizine (ZYRTEC) 10 MG tablet Take 10 mg by mouth daily. Patient taking differently: Take 10 mg by mouth as needed.     [provider]  Estradiol 10 MCG TABS vaginal tablet Place 1 tablet vaginally 2 (two) times a week.    [provider]  fluticasone  (FLONASE ) 50 MCG/ACT nasal spray Place 1 spray into both nostrils as needed for allergies or rhinitis.    [provider]  Magnesium  250 MG TABS Take 250 mg by mouth daily.    [provider]  metoprolol  tartrate (LOPRESSOR ) 25 MG tablet TAKE 1 TABLET TWICE A DAY 05/23/23   Terra Fairy PARAS, PA-C  metroNIDAZOLE (METROGEL) 0.75 % gel Apply 1 application. topically daily.    [provider]  Multiple Vitamin (MULTIVITAMIN) tablet Take 1 tablet by mouth daily.    [provider]  Multiple Vitamins-Minerals (VITAMIN-MINERAL SUPPLEMENT PO) Name: Advanced Collagen: Take one every day for supplement.    [provider]  Probiotic Product ([employer] DAILY PROBIOTIC PO) Take 1 capsule by mouth daily. 04/27/19   [provider]  Propylene Glycol (SYSTANE COMPLETE) 0.6 % SOLN Place 1 drop into both eyes 2 (two) times daily as needed (dry eyes).    [provider]  rosuvastatin  (CRESTOR ) 10 MG tablet Take 10 mg by mouth at bedtime. 04/01/21   [provider]    Allergies: Other, Alaway [ketotifen fumarate], and Pollen extract    Review of Systems  Updated Vital Signs BP (!) 165/69 (BP Location: Left Arm)   Pulse 66  Temp 98.6 F (37 C) (Oral)   Resp 18   SpO2 100%   Physical Exam Vitals and nursing note reviewed.  Constitutional:      General: She is not in acute distress.    Appearance: Normal appearance.  HENT:     Head: Normocephalic and atraumatic.     Nose: Nose normal.     Mouth/Throat:     Mouth: Mucous membranes are moist.  Eyes:     Extraocular Movements: Extraocular movements intact.     Conjunctiva/sclera: Conjunctivae normal.  Cardiovascular:     Rate and Rhythm: Normal rate and regular rhythm.     Heart sounds: Normal heart sounds.  Pulmonary:     Effort:  Pulmonary effort is normal.     Breath sounds: Normal breath sounds.     Comments: No significant L-sided chest wall tenderness to palpation  Abdominal:     General: Abdomen is flat.     Palpations: Abdomen is soft.     Tenderness: There is no abdominal tenderness.  Musculoskeletal:        General: Normal range of motion.     Cervical back: Normal range of motion and neck supple.     Comments: No bony tenderness to bilateral UE or LE  Skin:    General: Skin is warm and dry.     Findings: No bruising.  Neurological:     General: No focal deficit present.     Mental Status: She is alert and oriented to person, place, and time.  Psychiatric:        Mood and Affect: Mood normal.        Behavior: Behavior normal.     (all labs ordered are listed, but only abnormal results are displayed) Labs Reviewed  COMPREHENSIVE METABOLIC PANEL WITH GFR - Abnormal; Notable for the following components:      Result Value   Glucose, Bld 112 (*)    All other components within normal limits  CBC - Abnormal; Notable for the following components:   Hemoglobin 15.1 (*)    HCT 46.2 (*)    MCV 102.7 (*)    All other components within normal limits  URINALYSIS, ROUTINE W REFLEX MICROSCOPIC - Abnormal; Notable for the following components:   Hgb urine dipstick SMALL (*)    Ketones, ur 80 (*)    Leukocytes,Ua TRACE (*)    Bacteria, UA RARE (*)    All other components within normal limits  I-STAT CHEM 8, ED - Abnormal; Notable for the following components:   BUN 27 (*)    Glucose, Bld 114 (*)    Calcium , Ion 1.08 (*)    Hemoglobin 16.0 (*)    HCT 47.0 (*)    All other components within normal limits  PROTIME-INR  I-STAT CG4 LACTIC ACID, ED  SAMPLE TO BLOOD BANK    EKG: None  Radiology: CT CHEST ABDOMEN PELVIS W CONTRAST Result Date: 04/07/2024 CLINICAL DATA:  Patient is status post fall with known multiple rib fractures and LEFT pneumothorax EXAM: CT CHEST, ABDOMEN, AND PELVIS WITH CONTRAST  TECHNIQUE: Multidetector CT imaging of the chest, abdomen and pelvis was performed following the standard protocol during bolus administration of intravenous contrast. RADIATION DOSE REDUCTION: This exam was performed according to the departmental dose-optimization program which includes automated exposure control, adjustment of the mA and/or kV according to patient size and/or use of iterative reconstruction technique. CONTRAST:  OMNIPAQUE  IOHEXOL  300 MG/ML  SOLN COMPARISON:  Same day radiograph.  December 15, 2019 FINDINGS: CT CHEST FINDINGS Cardiovascular: Heart is normal in size. No pericardial effusion. Atherosclerotic calcifications of the nonaneurysmal abdominal aorta. Mediastinum/Nodes: Visualized thyroid is unremarkable. No axillary or mediastinal adenopathy. Lungs/Pleura: There is a small to moderate LEFT-sided pneumothorax. There is a small to moderate pleural effusion which measures slightly higher density than simple fluid and likely reflects a degree of hemothorax. Moderate centrilobular emphysema. Near complete atelectasis of the lingula and LEFT lower lobe. Musculoskeletal: There are multiple LEFT-sided rib fractures involving the anterolateral fifth through eighth ribs. There is adjacent periosteal reaction suggesting subacute nature. There are multiple fractures of the sixth rib laterally. There is an additional nondisplaced fracture of the of the posterior seventh and eighth ribs. There is a comminuted fracture involving the posterolateral ninth rib with adjacent periosteal reaction. There are at least 3 fractures of the eighth rib. CT ABDOMEN PELVIS FINDINGS Hepatobiliary: Low-density of the liver diffusely. Gallbladder is unremarkable. Subcentimeter hypodense lesions are too small to accurately characterize. Pancreas: Unremarkable. No pancreatic ductal dilatation or surrounding inflammatory changes. Spleen: Normal in size without focal abnormality. Adrenals/Urinary Tract: Adrenal glands are  unremarkable. Kidneys enhance symmetrically. No hydronephrosis. No obstructing nephrolithiasis. Bladder is obscured by metallic streak artifact. Stomach/Bowel: No evidence of bowel obstruction. Appendiceal candidate is unremarkable. No ancillary evidence of appendicitis. Small hiatal hernia. Vascular/Lymphatic: Sclerotic calcifications of the nonaneurysmal abdominal aorta. No suspicious lymphadenopathy visualized. Circumaortic LEFT renal vein. Reproductive: Pelvis is obscured by streak artifact. Other: No free air. Musculoskeletal: Status post RIGHT hip arthroplasty. Predominately facet arthropathy of the lumbar spine. No acute fracture or static subluxation of the lumbar spine. IMPRESSION: 1. There are multiple subacute appearing LEFT-sided rib fractures involving the fifth through ninth ribs, several which are fractured in multiple locations. There are at least 3 fractures involving the eighth rib. 2. There is a small to moderate LEFT-sided pneumothorax. There is a small to moderate LEFT pleural effusion which measures slightly higher density than simple fluid and likely reflects a degree of hemothorax. 3. Near complete atelectasis of the lingula and LEFT lower lobe. 4. No acute traumatic injury within the abdomen or pelvis. 5. Favored underlying hepatic steatosis. Aortic Atherosclerosis (ICD10-I70.0) and Emphysema (ICD10-J43.9). Electronically Signed   By: Corean Salter M.D.   On: 04/07/2024 17:28   CT CERVICAL SPINE WO CONTRAST Result Date: 04/07/2024 EXAM: CT CERVICAL SPINE WITHOUT CONTRAST 04/07/2024 04:51:22 PM TECHNIQUE: CT of the cervical spine was performed without the administration of intravenous contrast. Multiplanar reformatted images are provided for review. Automated exposure control, iterative reconstruction, and/or weight based adjustment of the mA/kV was utilized to reduce the radiation dose to as low as reasonably achievable. COMPARISON: None available. CLINICAL HISTORY: Polytrauma,  blunt. Fall before Thanksgiving. FINDINGS: BONES AND ALIGNMENT: Normal alignment. No acute fracture or suspicious lesion. DEGENERATIVE CHANGES: Moderate atlantodental degenerative changes. Mild cervical spondylosis and moderately advanced facet arthrosis. No evidence of high grade spinal canal stenosis. SOFT TISSUES: No prevertebral soft tissue swelling. The chest is reported separately. IMPRESSION: 1. No acute cervical spine fracture or traumatic malalignment. Electronically signed by: Dasie Hamburg MD 04/07/2024 05:03 PM EST RP Workstation: [id number]   CT HEAD WO CONTRAST Result Date: 04/07/2024 EXAM: CT HEAD WITHOUT 04/07/2024 04:51:22 PM TECHNIQUE: CT of the head was performed without the administration of intravenous contrast. Automated exposure control, iterative reconstruction, and/or weight based adjustment of the mA/kV was utilized to reduce the radiation dose to as low as reasonably achievable. COMPARISON: None available. CLINICAL HISTORY: Head trauma, moderate-severe. Fall before thanksgiving. FINDINGS: BRAIN  AND VENTRICLES: There is no evidence of an acute infarct, intracranial hemorrhage, mass, midline shift, hydrocephalus, or extra-axial fluid collection. There is mild generalized cerebral atrophy. Cerebral white matter hypodensities are nonspecific but compatible with mild chronic small vessel ischemic disease. ORBITS: Bilateral cataract extraction. SINUSES AND MASTOIDS: Mucosal thickening in the left sphenoid sinus. Clear mastoid air cells. SOFT TISSUES AND SKULL: No acute skull fracture. No acute soft tissue abnormality. IMPRESSION: 1. No acute intracranial abnormality. 2. Mild chronic small vessel ischemic disease. Electronically signed by: Dasie Hamburg MD 04/07/2024 04:59 PM EST RP Workstation: [id number]     Procedures   Medications Ordered in the ED  lidocaine  (LIDODERM ) 5 % 1-3 patch (1 patch Transdermal Patch Applied 04/07/24 1658)  iohexol  (OMNIPAQUE ) 300 MG/ML solution 100 mL (100  mLs Intravenous Contrast Given 04/07/24 1635)  metoprolol  tartrate (LOPRESSOR ) tablet 25 mg (25 mg Oral Given 04/07/24 1656)  oxyCODONE -acetaminophen  (PERCOCET/ROXICET) 5-325 MG per tablet 1 tablet (1 tablet Oral Given 04/07/24 1656)    Clinical Course as of 04/07/24 2000  Sat Apr 07, 2024  1738 No acute injury on CTH/C-spine. CTAP with Rib 5-9 fx with fractures in multiple places, small to moderate pneumothorax with small to moderate pleural effusion vs hemothorax with compressive atelectasis. Will discuss with trauma for disposition recommendations.  [VK]  1858 I spoke with Dr. Debby with general surgery who will evaluate the patient for disposition.  [VK]  1931 Trauma recommends admission to medicine at [facility] and will see in consult.  [VK]    Clinical Course User Index [VK] Kingsley, Kainen Struckman K, DO                                 Medical Decision Making This patient presents to the ED with chief complaint(s) of fall, rib pain with pertinent past medical history of A fib on Eliquis  which further complicates the presenting complaint. The complaint involves an extensive differential diagnosis and also carries with it a high risk of complications and morbidity.    The differential diagnosis includes rib fractures, pneumothorax, hemothorax, pulmonary contusion, other blunt abdominal thoracic injury, ICH, mass effect, cervical spine fracture, no presyncopal symptoms a syncopal fall unlikely  Additional history obtained: Additional history obtained from spouse Records reviewed Care Everywhere/External Records  ED Course and Reassessment: On patient's arrival she was initially tachycardic and hypertensive but otherwise hemodynamically stable in no acute distress.  She was satting 91% initially in triage though vitals improved on her arrival into the room.  She did have an outpatient x-ray this morning but we are unable to see in the system but the read did report a small to moderate pneumothorax  with possible hemothorax and 3 rib fractures.  The patient was placed on nonrebreather mask.  She will CT imaging to further evaluate for her injuries.  With her head injury as well will have a CT head and C-spine and she will be closely reassessed.  Independent labs interpretation:  The following labs were independently interpreted: within normal range  Independent visualization of imaging: - I independently visualized the following imaging with scope of interpretation limited to determining acute life threatening conditions related to emergency care: CTH/C-spine, CTCAP, which revealed No head or neck injury, rib 5-9 fractures with small pneumo and likely hemothorax  Consultation: - Consulted or discussed management/test interpretation w/ external professional: trauma, hospitalist  Consideration for admission or further workup: patient requires admission for traumatic injuries Social Determinants of  health: N/A    Amount and/or Complexity of Data Reviewed Labs: ordered. Radiology: ordered.  Risk Prescription drug management. Decision regarding hospitalization.       Final diagnoses:  Pneumothorax, left  Hemothorax on left  Closed fracture of multiple ribs of left side, initial encounter    ED Discharge Orders     None          Ellouise Richerd POUR, DO 04/07/24 2000

## 2024-04-07 NOTE — ED Provider Triage Note (Signed)
 Emergency Medicine Provider Triage Evaluation Note  Katrina Oliver , a 78 y.o. female  was evaluated in triage.  Pt complains of rib pain and shortness of breath.  She did have a fall shortly before Thanksgiving and obtained initial x-rays which showed no evidence of broken ribs.  Patient began reporting shortness of breath and significant pain rated 8 out of 10 starting last night and into this morning.  She was seen at [facility] urgent care prior to coming to the emergency department and told she had 3 moderately displaced broken ribs and a partial pneumothorax on the left.  Patient is mildly tearful in triage and has difficulty taking deep breaths.  Review of Systems  Positive: Shortness of breath, pain Negative:   Physical Exam  BP (!) 203/96   Pulse (!) 114   Temp 98.1 F (36.7 C)   Resp 17   SpO2 91%  Gen:   Awake, no distress, mildly tearful Resp:  Tachypneic, tachycardic MSK:   Moves extremities without difficulty  Other:  Breath sounds are diminished  Medical Decision Making  Medically screening exam initiated at 2:46 PM.  Appropriate orders placed.  Katrina Oliver was informed that the remainder of the evaluation will be completed by another provider, this initial triage assessment does not replace that evaluation, and the importance of remaining in the ED until their evaluation is complete.  CT chest abdomen pelvis with contrast ordered.  Patient taken immediately to recess bay.  Dr. Franklyn aware.   Katrina Marry RAMAN, PA-C 04/07/24 1501

## 2024-04-07 NOTE — ED Notes (Signed)
 EDP and Primary RN notified of elevated blood pressure reading

## 2024-04-07 NOTE — ED Triage Notes (Signed)
 Pt sent here from [facility] for 3 right side broken ribs and partially collapsed lung- resulting from a recent fall

## 2024-04-07 NOTE — Consult Note (Signed)
 CC: L sided chest pain  Requesting provider: Dr Asencion  HPI: Katrina Oliver is an 78 y.o. female who is here for left-sided chest pain that was present when she woke up this morning.  She has a history of a fall approximately 2 weeks ago.  She was seen in urgent care after this and told that there were no fractures or injuries.  When she woke up in pain today, she went back to urgent care and was noted to have several rib fractures and sent to [facility] emergency department for further evaluation.  CT scans of the chest abdomen and pelvis as well as neck and head were performed.  Left-sided rib fractures were found in ribs 5 through 9.  There is a hemo and pneumothorax noted in the posterior aspect of the left lung.  Past Medical History:  Diagnosis Date   A-fib [facility])    Aortic atherosclerosis    Dysrhythmia    Hip pain    HTN (hypertension)    Hypercholesteremia    Osteoarthritis    PAF (paroxysmal atrial fibrillation) (HCC)     Past Surgical History:  Procedure Laterality Date   RIGHT OOPHORECTOMY  2006   TOTAL HIP ARTHROPLASTY Right 07/29/2021   Procedure: TOTAL HIP ARTHROPLASTY ANTERIOR APPROACH;  Surgeon: Fidel Rogue, MD;  Location: [facility] ORS;  Service: Orthopedics;  Laterality: Right;  150   TUBAL LIGATION  1982    No family history on file.  Social:  reports that she quit smoking about 19 years ago. Her smoking use included cigarettes. She started smoking about 49 years ago. She has a 7.5 pack-year smoking history. She has never used smokeless tobacco. She reports current alcohol  use of about 12.0 standard drinks of alcohol  per week. She reports that she does not use drugs.  Allergies: Allergies[1]  Medications: I have reviewed the patient's current medications.  Results for orders placed or performed during the hospital encounter of 04/07/24 (from the past 48 hours)  Comprehensive metabolic panel     Status: Abnormal   Collection Time: 04/07/24  3:25 PM   Result Value Ref Range   Sodium 140 135 - 145 mmol/L   Potassium 3.9 3.5 - 5.1 mmol/L   Chloride 101 98 - 111 mmol/L   CO2 27 22 - 32 mmol/L   Glucose, Bld 112 (H) 70 - 99 mg/dL    Comment: Glucose reference range applies only to samples taken after fasting for at least 8 hours.   BUN 19 8 - 23 mg/dL   Creatinine, Ser 9.33 0.44 - 1.00 mg/dL   Calcium  9.6 8.9 - 10.3 mg/dL   Total Protein 7.2 6.5 - 8.1 g/dL   Albumin 4.4 3.5 - 5.0 g/dL   AST 25 15 - 41 U/L    Comment: HEMOLYSIS AT THIS LEVEL MAY AFFECT RESULT   ALT 15 0 - 44 U/L   Alkaline Phosphatase 93 38 - 126 U/L   Total Bilirubin 0.7 0.0 - 1.2 mg/dL   GFR, Estimated >39 >39 mL/min    Comment: (NOTE) Calculated using the CKD-EPI Creatinine Equation (2021)    Anion gap 12 5 - 15    Comment: Performed at [facility], [address], [place], [place] [address]  CBC     Status: Abnormal   Collection Time: 04/07/24  3:25 PM  Result Value Ref Range   WBC 6.7 4.0 - 10.5 K/uL   RBC 4.50 3.87 - 5.11 MIL/uL   Hemoglobin 15.1 (  H) 12.0 - 15.0 g/dL   HCT 53.7 (H) 63.9 - 53.9 %   MCV 102.7 (H) 80.0 - 100.0 fL   MCH 33.6 26.0 - 34.0 pg   MCHC 32.7 30.0 - 36.0 g/dL   RDW 86.7 88.4 - 84.4 %   Platelets 181 150 - 400 K/uL   nRBC 0.0 0.0 - 0.2 %    Comment: Performed at [facility], [address] [place], [place] [address]  Protime-INR     Status: None   Collection Time: 04/07/24  3:25 PM  Result Value Ref Range   Prothrombin Time 13.6 11.4 - 15.2 seconds   INR 1.0 0.8 - 1.2    Comment: (NOTE) INR goal varies based on device and disease states. Performed at [facility], [address], [place], [place] [address]   Sample to Blood Bank     Status: None   Collection Time: 04/07/24  3:25 PM  Result Value Ref Range   Blood Bank Specimen SAMPLE AVAILABLE FOR TESTING    Sample Expiration      04/10/2024,2359 Performed at [facility], [address], [place], [place] [address]   I-Stat Chem 8, ED     Status: Abnormal   Collection Time: 04/07/24  3:37 PM  Result Value Ref Range   Sodium 138 135 - 145 mmol/L   Potassium 4.5 3.5 - 5.1 mmol/L   Chloride 101 98 - 111 mmol/L   BUN 27 (H) 8 - 23 mg/dL   Creatinine, Ser 9.39 0.44 - 1.00 mg/dL   Glucose, Bld 885 (H) 70 - 99 mg/dL    Comment: Glucose reference range applies only to samples taken after fasting for at least 8 hours.   Calcium , Ion 1.08 (L) 1.15 - 1.40 mmol/L   TCO2 27 22 - 32 mmol/L   Hemoglobin 16.0 (H) 12.0 - 15.0 g/dL   HCT 52.9 (H) 63.9 - 53.9 %  I-Stat Lactic Acid, ED     Status: None   Collection Time: 04/07/24  3:37 PM  Result Value Ref Range   Lactic Acid, Venous 1.3 0.5 - 1.9 mmol/L  Urinalysis, Routine w reflex microscopic -Urine, Clean Catch     Status: Abnormal   Collection Time: 04/07/24  4:29 PM  Result Value Ref Range   Color, Urine YELLOW YELLOW   APPearance CLEAR CLEAR   Specific Gravity, Urine 1.024 1.005 - 1.030   pH 5.0 5.0 - 8.0   Glucose, UA NEGATIVE NEGATIVE mg/dL   Hgb urine dipstick SMALL (A) NEGATIVE   Bilirubin Urine NEGATIVE NEGATIVE   Ketones, ur 80 (A) NEGATIVE mg/dL   Protein, ur NEGATIVE NEGATIVE mg/dL   Nitrite NEGATIVE NEGATIVE   Leukocytes,Ua TRACE (A) NEGATIVE   RBC / HPF 0-5 0 - 5 RBC/hpf   WBC, UA 0-5 0 - 5 WBC/hpf   Bacteria, UA RARE (A) NONE SEEN   Squamous Epithelial / HPF 0-5 0 - 5 /HPF   Mucus PRESENT     Comment: Performed at [facility], [address], [place], [place] [address]    CT CHEST ABDOMEN PELVIS W CONTRAST Result Date: 04/07/2024 CLINICAL DATA:  Patient is status post fall with known multiple rib fractures and LEFT pneumothorax EXAM: CT CHEST, ABDOMEN, AND PELVIS WITH CONTRAST TECHNIQUE: Multidetector CT imaging of the chest, abdomen and pelvis was performed following the standard protocol during bolus administration of intravenous contrast. RADIATION DOSE REDUCTION: This exam was  performed according to the  departmental dose-optimization program which includes automated exposure control, adjustment of the mA and/or kV according to patient size and/or use of iterative reconstruction technique. CONTRAST:  OMNIPAQUE  IOHEXOL  300 MG/ML  SOLN COMPARISON:  Same day radiograph.  December 15, 2019 FINDINGS: CT CHEST FINDINGS Cardiovascular: Heart is normal in size. No pericardial effusion. Atherosclerotic calcifications of the nonaneurysmal abdominal aorta. Mediastinum/Nodes: Visualized thyroid is unremarkable. No axillary or mediastinal adenopathy. Lungs/Pleura: There is a small to moderate LEFT-sided pneumothorax. There is a small to moderate pleural effusion which measures slightly higher density than simple fluid and likely reflects a degree of hemothorax. Moderate centrilobular emphysema. Near complete atelectasis of the lingula and LEFT lower lobe. Musculoskeletal: There are multiple LEFT-sided rib fractures involving the anterolateral fifth through eighth ribs. There is adjacent periosteal reaction suggesting subacute nature. There are multiple fractures of the sixth rib laterally. There is an additional nondisplaced fracture of the of the posterior seventh and eighth ribs. There is a comminuted fracture involving the posterolateral ninth rib with adjacent periosteal reaction. There are at least 3 fractures of the eighth rib. CT ABDOMEN PELVIS FINDINGS Hepatobiliary: Low-density of the liver diffusely. Gallbladder is unremarkable. Subcentimeter hypodense lesions are too small to accurately characterize. Pancreas: Unremarkable. No pancreatic ductal dilatation or surrounding inflammatory changes. Spleen: Normal in size without focal abnormality. Adrenals/Urinary Tract: Adrenal glands are unremarkable. Kidneys enhance symmetrically. No hydronephrosis. No obstructing nephrolithiasis. Bladder is obscured by metallic streak artifact. Stomach/Bowel: No evidence of bowel obstruction. Appendiceal  candidate is unremarkable. No ancillary evidence of appendicitis. Small hiatal hernia. Vascular/Lymphatic: Sclerotic calcifications of the nonaneurysmal abdominal aorta. No suspicious lymphadenopathy visualized. Circumaortic LEFT renal vein. Reproductive: Pelvis is obscured by streak artifact. Other: No free air. Musculoskeletal: Status post RIGHT hip arthroplasty. Predominately facet arthropathy of the lumbar spine. No acute fracture or static subluxation of the lumbar spine. IMPRESSION: 1. There are multiple subacute appearing LEFT-sided rib fractures involving the fifth through ninth ribs, several which are fractured in multiple locations. There are at least 3 fractures involving the eighth rib. 2. There is a small to moderate LEFT-sided pneumothorax. There is a small to moderate LEFT pleural effusion which measures slightly higher density than simple fluid and likely reflects a degree of hemothorax. 3. Near complete atelectasis of the lingula and LEFT lower lobe. 4. No acute traumatic injury within the abdomen or pelvis. 5. Favored underlying hepatic steatosis. Aortic Atherosclerosis (ICD10-I70.0) and Emphysema (ICD10-J43.9). Electronically Signed   By: Corean Salter M.D.   On: 04/07/2024 17:28   CT CERVICAL SPINE WO CONTRAST Result Date: 04/07/2024 EXAM: CT CERVICAL SPINE WITHOUT CONTRAST 04/07/2024 04:51:22 PM TECHNIQUE: CT of the cervical spine was performed without the administration of intravenous contrast. Multiplanar reformatted images are provided for review. Automated exposure control, iterative reconstruction, and/or weight based adjustment of the mA/kV was utilized to reduce the radiation dose to as low as reasonably achievable. COMPARISON: None available. CLINICAL HISTORY: Polytrauma, blunt. Fall before Thanksgiving. FINDINGS: BONES AND ALIGNMENT: Normal alignment. No acute fracture or suspicious lesion. DEGENERATIVE CHANGES: Moderate atlantodental degenerative changes. Mild cervical  spondylosis and moderately advanced facet arthrosis. No evidence of high grade spinal canal stenosis. SOFT TISSUES: No prevertebral soft tissue swelling. The chest is reported separately. IMPRESSION: 1. No acute cervical spine fracture or traumatic malalignment. Electronically signed by: Dasie Hamburg MD 04/07/2024 05:03 PM EST RP Workstation: [id number]   CT HEAD WO CONTRAST Result Date: 04/07/2024 EXAM: CT HEAD WITHOUT 04/07/2024 04:51:22 PM TECHNIQUE: CT of the head was performed without the administration of  intravenous contrast. Automated exposure control, iterative reconstruction, and/or weight based adjustment of the mA/kV was utilized to reduce the radiation dose to as low as reasonably achievable. COMPARISON: None available. CLINICAL HISTORY: Head trauma, moderate-severe. Fall before thanksgiving. FINDINGS: BRAIN AND VENTRICLES: There is no evidence of an acute infarct, intracranial hemorrhage, mass, midline shift, hydrocephalus, or extra-axial fluid collection. There is mild generalized cerebral atrophy. Cerebral white matter hypodensities are nonspecific but compatible with mild chronic small vessel ischemic disease. ORBITS: Bilateral cataract extraction. SINUSES AND MASTOIDS: Mucosal thickening in the left sphenoid sinus. Clear mastoid air cells. SOFT TISSUES AND SKULL: No acute skull fracture. No acute soft tissue abnormality. IMPRESSION: 1. No acute intracranial abnormality. 2. Mild chronic small vessel ischemic disease. Electronically signed by: Dasie Hamburg MD 04/07/2024 04:59 PM EST RP Workstation: [id number]    ROS - all of the below systems have been reviewed with the patient and positives are indicated with bold text General: chills, fever or night sweats Eyes: blurry vision or double vision ENT: epistaxis or sore throat Hematologic/Lymphatic: bleeding problems, blood clots or swollen lymph nodes Endocrine: temperature intolerance or unexpected weight changes Breast: new or changing  breast lumps or nipple discharge Resp: cough, shortness of breath, or wheezing CV: chest pain or dyspnea on exertion GI: as per HPI GU: dysuria, trouble voiding, or hematuria Neuro: TIA or stroke symptoms    PE Blood pressure (!) 165/69, pulse 66, temperature 98.6 F (37 C), temperature source Oral, resp. rate 18, SpO2 100%. Constitutional: NAD; conversant; no deformities Eyes: Moist conjunctiva; no lid lag; anicteric; PERRL Neck: Trachea midline; no thyromegaly Lungs: Normal respiratory effort, tender to palpation left chest CV: RRR GI: Abd soft MSK: Normal range of motion of extremities; no clubbing/cyanosis Psychiatric: Appropriate affect; alert and oriented x3  Results for orders placed or performed during the hospital encounter of 04/07/24 (from the past 48 hours)  Comprehensive metabolic panel     Status: Abnormal   Collection Time: 04/07/24  3:25 PM  Result Value Ref Range   Sodium 140 135 - 145 mmol/L   Potassium 3.9 3.5 - 5.1 mmol/L   Chloride 101 98 - 111 mmol/L   CO2 27 22 - 32 mmol/L   Glucose, Bld 112 (H) 70 - 99 mg/dL    Comment: Glucose reference range applies only to samples taken after fasting for at least 8 hours.   BUN 19 8 - 23 mg/dL   Creatinine, Ser 9.33 0.44 - 1.00 mg/dL   Calcium  9.6 8.9 - 10.3 mg/dL   Total Protein 7.2 6.5 - 8.1 g/dL   Albumin 4.4 3.5 - 5.0 g/dL   AST 25 15 - 41 U/L    Comment: HEMOLYSIS AT THIS LEVEL MAY AFFECT RESULT   ALT 15 0 - 44 U/L   Alkaline Phosphatase 93 38 - 126 U/L   Total Bilirubin 0.7 0.0 - 1.2 mg/dL   GFR, Estimated >39 >39 mL/min    Comment: (NOTE) Calculated using the CKD-EPI Creatinine Equation (2021)    Anion gap 12 5 - 15    Comment: Performed at [facility], [address] [place], [place] [address]  CBC     Status: Abnormal   Collection Time: 04/07/24  3:25 PM  Result Value Ref Range   WBC 6.7 4.0 - 10.5 K/uL   RBC 4.50 3.87 - 5.11 MIL/uL   Hemoglobin 15.1 (H) 12.0 - 15.0 g/dL    HCT 53.7 (H) 63.9 - 46.0 %   MCV 102.7 (H) 80.0 -  100.0 fL   MCH 33.6 26.0 - 34.0 pg   MCHC 32.7 30.0 - 36.0 g/dL   RDW 86.7 88.4 - 84.4 %   Platelets 181 150 - 400 K/uL   nRBC 0.0 0.0 - 0.2 %    Comment: Performed at [facility] [address], [place], [place] [address]  Protime-INR     Status: None   Collection Time: 04/07/24  3:25 PM  Result Value Ref Range   Prothrombin Time 13.6 11.4 - 15.2 seconds   INR 1.0 0.8 - 1.2    Comment: (NOTE) INR goal varies based on device and disease states. Performed at [facility], [address], [place], [place] [address]   Sample to Blood Bank     Status: None   Collection Time: 04/07/24  3:25 PM  Result Value Ref Range   Blood Bank Specimen SAMPLE AVAILABLE FOR TESTING    Sample Expiration      04/10/2024,2359 Performed at [facility], [address] [place], [place] [address]   I-Stat Chem 8, ED     Status: Abnormal   Collection Time: 04/07/24  3:37 PM  Result Value Ref Range   Sodium 138 135 - 145 mmol/L   Potassium 4.5 3.5 - 5.1 mmol/L   Chloride 101 98 - 111 mmol/L   BUN 27 (H) 8 - 23 mg/dL   Creatinine, Ser 9.39 0.44 - 1.00 mg/dL   Glucose, Bld 885 (H) 70 - 99 mg/dL    Comment: Glucose reference range applies only to samples taken after fasting for at least 8 hours.   Calcium , Ion 1.08 (L) 1.15 - 1.40 mmol/L   TCO2 27 22 - 32 mmol/L   Hemoglobin 16.0 (H) 12.0 - 15.0 g/dL   HCT 52.9 (H) 63.9 - 53.9 %  I-Stat Lactic Acid, ED     Status: None   Collection Time: 04/07/24  3:37 PM  Result Value Ref Range   Lactic Acid, Venous 1.3 0.5 - 1.9 mmol/L  Urinalysis, Routine w reflex microscopic -Urine, Clean Catch     Status: Abnormal   Collection Time: 04/07/24  4:29 PM  Result Value Ref Range   Color, Urine YELLOW YELLOW   APPearance CLEAR CLEAR   Specific Gravity, Urine 1.024 1.005 - 1.030   pH 5.0 5.0 - 8.0   Glucose, UA NEGATIVE NEGATIVE mg/dL   Hgb urine dipstick  SMALL (A) NEGATIVE   Bilirubin Urine NEGATIVE NEGATIVE   Ketones, ur 80 (A) NEGATIVE mg/dL   Protein, ur NEGATIVE NEGATIVE mg/dL   Nitrite NEGATIVE NEGATIVE   Leukocytes,Ua TRACE (A) NEGATIVE   RBC / HPF 0-5 0 - 5 RBC/hpf   WBC, UA 0-5 0 - 5 WBC/hpf   Bacteria, UA RARE (A) NONE SEEN   Squamous Epithelial / HPF 0-5 0 - 5 /HPF   Mucus PRESENT     Comment: Performed at [facility], [address], [place], [place] [address]    CT CHEST ABDOMEN PELVIS W CONTRAST Result Date: 04/07/2024 CLINICAL DATA:  Patient is status post fall with known multiple rib fractures and LEFT pneumothorax EXAM: CT CHEST, ABDOMEN, AND PELVIS WITH CONTRAST TECHNIQUE: Multidetector CT imaging of the chest, abdomen and pelvis was performed following the standard protocol during bolus administration of intravenous contrast. RADIATION DOSE REDUCTION: This exam was performed according to the departmental dose-optimization program which includes automated exposure control, adjustment of the mA and/or kV according to patient size and/or use of iterative  reconstruction technique. CONTRAST:  OMNIPAQUE  IOHEXOL  300 MG/ML  SOLN COMPARISON:  Same day radiograph.  December 15, 2019 FINDINGS: CT CHEST FINDINGS Cardiovascular: Heart is normal in size. No pericardial effusion. Atherosclerotic calcifications of the nonaneurysmal abdominal aorta. Mediastinum/Nodes: Visualized thyroid is unremarkable. No axillary or mediastinal adenopathy. Lungs/Pleura: There is a small to moderate LEFT-sided pneumothorax. There is a small to moderate pleural effusion which measures slightly higher density than simple fluid and likely reflects a degree of hemothorax. Moderate centrilobular emphysema. Near complete atelectasis of the lingula and LEFT lower lobe. Musculoskeletal: There are multiple LEFT-sided rib fractures involving the anterolateral fifth through eighth ribs. There is adjacent periosteal reaction suggesting subacute nature.  There are multiple fractures of the sixth rib laterally. There is an additional nondisplaced fracture of the of the posterior seventh and eighth ribs. There is a comminuted fracture involving the posterolateral ninth rib with adjacent periosteal reaction. There are at least 3 fractures of the eighth rib. CT ABDOMEN PELVIS FINDINGS Hepatobiliary: Low-density of the liver diffusely. Gallbladder is unremarkable. Subcentimeter hypodense lesions are too small to accurately characterize. Pancreas: Unremarkable. No pancreatic ductal dilatation or surrounding inflammatory changes. Spleen: Normal in size without focal abnormality. Adrenals/Urinary Tract: Adrenal glands are unremarkable. Kidneys enhance symmetrically. No hydronephrosis. No obstructing nephrolithiasis. Bladder is obscured by metallic streak artifact. Stomach/Bowel: No evidence of bowel obstruction. Appendiceal candidate is unremarkable. No ancillary evidence of appendicitis. Small hiatal hernia. Vascular/Lymphatic: Sclerotic calcifications of the nonaneurysmal abdominal aorta. No suspicious lymphadenopathy visualized. Circumaortic LEFT renal vein. Reproductive: Pelvis is obscured by streak artifact. Other: No free air. Musculoskeletal: Status post RIGHT hip arthroplasty. Predominately facet arthropathy of the lumbar spine. No acute fracture or static subluxation of the lumbar spine. IMPRESSION: 1. There are multiple subacute appearing LEFT-sided rib fractures involving the fifth through ninth ribs, several which are fractured in multiple locations. There are at least 3 fractures involving the eighth rib. 2. There is a small to moderate LEFT-sided pneumothorax. There is a small to moderate LEFT pleural effusion which measures slightly higher density than simple fluid and likely reflects a degree of hemothorax. 3. Near complete atelectasis of the lingula and LEFT lower lobe. 4. No acute traumatic injury within the abdomen or pelvis. 5. Favored underlying hepatic  steatosis. Aortic Atherosclerosis (ICD10-I70.0) and Emphysema (ICD10-J43.9). Electronically Signed   By: Corean Salter M.D.   On: 04/07/2024 17:28   CT CERVICAL SPINE WO CONTRAST Result Date: 04/07/2024 EXAM: CT CERVICAL SPINE WITHOUT CONTRAST 04/07/2024 04:51:22 PM TECHNIQUE: CT of the cervical spine was performed without the administration of intravenous contrast. Multiplanar reformatted images are provided for review. Automated exposure control, iterative reconstruction, and/or weight based adjustment of the mA/kV was utilized to reduce the radiation dose to as low as reasonably achievable. COMPARISON: None available. CLINICAL HISTORY: Polytrauma, blunt. Fall before Thanksgiving. FINDINGS: BONES AND ALIGNMENT: Normal alignment. No acute fracture or suspicious lesion. DEGENERATIVE CHANGES: Moderate atlantodental degenerative changes. Mild cervical spondylosis and moderately advanced facet arthrosis. No evidence of high grade spinal canal stenosis. SOFT TISSUES: No prevertebral soft tissue swelling. The chest is reported separately. IMPRESSION: 1. No acute cervical spine fracture or traumatic malalignment. Electronically signed by: Dasie Hamburg MD 04/07/2024 05:03 PM EST RP Workstation: [id number]   CT HEAD WO CONTRAST Result Date: 04/07/2024 EXAM: CT HEAD WITHOUT 04/07/2024 04:51:22 PM TECHNIQUE: CT of the head was performed without the administration of intravenous contrast. Automated exposure control, iterative reconstruction, and/or weight based adjustment of the mA/kV was utilized to reduce the radiation dose to  as low as reasonably achievable. COMPARISON: None available. CLINICAL HISTORY: Head trauma, moderate-severe. Fall before thanksgiving. FINDINGS: BRAIN AND VENTRICLES: There is no evidence of an acute infarct, intracranial hemorrhage, mass, midline shift, hydrocephalus, or extra-axial fluid collection. There is mild generalized cerebral atrophy. Cerebral white matter hypodensities are  nonspecific but compatible with mild chronic small vessel ischemic disease. ORBITS: Bilateral cataract extraction. SINUSES AND MASTOIDS: Mucosal thickening in the left sphenoid sinus. Clear mastoid air cells. SOFT TISSUES AND SKULL: No acute skull fracture. No acute soft tissue abnormality. IMPRESSION: 1. No acute intracranial abnormality. 2. Mild chronic small vessel ischemic disease. Electronically signed by: Dasie Hamburg MD 04/07/2024 04:59 PM EST RP Workstation: [id number]     A/P: Katrina Oliver is an 78 y.o. female with hemopneumothorax status post fall on Eliquis .  Would recommend admission to medicine at [facility] with trauma consult.  Okay for diet.  Please hold Eliquis .  Trauma team will follow and monitor for need for chest tube.    Bernarda JAYSON Ned, MD  Colorectal and General Surgery [facility] Surgery   high medical decision making.     [1]  Allergies Allergen Reactions   Other Other (See Comments)    alloway eye drops   Alaway [Ketotifen Fumarate]     Eye drops - causes eye irritation, redness and swelling    Pollen Extract     Trees, Grass

## 2024-04-08 ENCOUNTER — Encounter (HOSPITAL_COMMUNITY): Payer: Self-pay | Admitting: Internal Medicine

## 2024-04-08 DIAGNOSIS — I482 Chronic atrial fibrillation, unspecified: Secondary | ICD-10-CM | POA: Insufficient documentation

## 2024-04-08 DIAGNOSIS — S2249XA Multiple fractures of ribs, unspecified side, initial encounter for closed fracture: Secondary | ICD-10-CM | POA: Insufficient documentation

## 2024-04-08 LAB — COMPREHENSIVE METABOLIC PANEL WITH GFR
ALT: 12 U/L (ref 0–44)
AST: 22 U/L (ref 15–41)
Albumin: 4 g/dL (ref 3.5–5.0)
Alkaline Phosphatase: 82 U/L (ref 38–126)
Anion gap: 10 (ref 5–15)
BUN: 17 mg/dL (ref 8–23)
CO2: 26 mmol/L (ref 22–32)
Calcium: 9 mg/dL (ref 8.9–10.3)
Chloride: 100 mmol/L (ref 98–111)
Creatinine, Ser: 0.55 mg/dL (ref 0.44–1.00)
GFR, Estimated: >60 mL/min (ref >60)
Glucose, Bld: 126 mg/dL — ABNORMAL HIGH (ref 70–99)
Potassium: 3.9 mmol/L (ref 3.5–5.1)
Sodium: 136 mmol/L (ref 135–145)
Total Bilirubin: 1.1 mg/dL (ref 0.0–1.2)
Total Protein: 6.7 g/dL (ref 6.5–8.1)

## 2024-04-08 LAB — CBC
HCT: 41.3 % (ref 36.0–46.0)
Hemoglobin: 13.6 g/dL (ref 12.0–15.0)
MCH: 33.8 pg (ref 26.0–34.0)
MCHC: 32.9 g/dL (ref 30.0–36.0)
MCV: 102.7 fL — ABNORMAL HIGH (ref 80.0–100.0)
Platelets: 159 K/uL (ref 150–400)
RBC: 4.02 MIL/uL (ref 3.87–5.11)
RDW: 13.1 % (ref 11.5–15.5)
WBC: 4.9 K/uL (ref 4.0–10.5)
nRBC: 0 % (ref 0.0–0.2)

## 2024-04-08 MED ORDER — PROPYLENE GLYCOL 0.6 % OP SOLN: 1.0000 [drp] | Freq: Two times a day (BID) | OPHTHALMIC | Status: DC | PRN

## 2024-04-08 MED ORDER — POLYVINYL ALCOHOL 1.4 % OP SOLN: 1.0000 [drp] | OPHTHALMIC | Status: DC | PRN

## 2024-04-08 MED ORDER — ADULT MULTIVITAMIN W/MINERALS CH
1.0000 | ORAL_TABLET | Freq: Every day | ORAL | Status: DC
  Administered 2024-04-08 – 2024-04-10 (×3): 1 via ORAL
  Filled 2024-04-08 (×3): qty 1

## 2024-04-08 MED ORDER — METOPROLOL TARTRATE 5 MG/5ML IV SOLN
5.0000 mg | INTRAVENOUS | Status: DC | PRN
  Filled 2024-04-08: qty 5

## 2024-04-08 MED ORDER — METOPROLOL TARTRATE 25 MG PO TABS
25.0000 mg | ORAL_TABLET | Freq: Once | ORAL | Status: AC
  Administered 2024-04-08: 25 mg via ORAL
  Filled 2024-04-08: qty 1

## 2024-04-08 MED ORDER — LORATADINE 10 MG PO TABS
10.0000 mg | ORAL_TABLET | Freq: Every day | ORAL | Status: DC
  Administered 2024-04-08 – 2024-04-10 (×3): 10 mg via ORAL
  Filled 2024-04-08 (×3): qty 1

## 2024-04-08 MED ORDER — ROSUVASTATIN CALCIUM 5 MG PO TABS
10.0000 mg | ORAL_TABLET | Freq: Every day | ORAL | Status: DC
  Administered 2024-04-08 – 2024-04-09 (×2): 10 mg via ORAL
  Filled 2024-04-08 (×2): qty 2

## 2024-04-08 MED ORDER — METOPROLOL TARTRATE 25 MG PO TABS
25.0000 mg | ORAL_TABLET | Freq: Two times a day (BID) | ORAL | Status: DC
  Administered 2024-04-08 – 2024-04-10 (×4): 25 mg via ORAL
  Filled 2024-04-08 (×4): qty 1

## 2024-04-08 MED ORDER — POLYETHYLENE GLYCOL 3350 17 G PO PACK
17.0000 g | PACK | Freq: Two times a day (BID) | ORAL | Status: AC
  Administered 2024-04-08 – 2024-04-09 (×2): 17 g via ORAL
  Filled 2024-04-08 (×4): qty 1

## 2024-04-08 MED ORDER — HYDRALAZINE HCL 25 MG PO TABS
25.0000 mg | ORAL_TABLET | Freq: Four times a day (QID) | ORAL | Status: DC | PRN
  Administered 2024-04-08: 25 mg via ORAL
  Filled 2024-04-08: qty 1

## 2024-04-08 NOTE — Progress Notes (Signed)
°   04/08/24 2228  Assess: MEWS Score  Temp 97.8 F (36.6 C)  BP (!) 161/73  MAP (mmHg) 97  Pulse Rate (!) 110  ECG Heart Rate (!) 162  Resp 18  Level of Consciousness Alert  SpO2 93 %  O2 Device Room Air  Assess: MEWS Score  MEWS Temp 0  MEWS Systolic 0  MEWS Pulse 3  MEWS RR 0  MEWS LOC 0  MEWS Score 3  MEWS Score Color Yellow  Assess: if the MEWS score is Yellow or Red  Were vital signs accurate and taken at a resting state? Yes  Does the patient meet 2 or more of the SIRS criteria? No  MEWS guidelines implemented  Yes, yellow  Treat  MEWS Interventions Considered administering scheduled or prn medications/treatments as ordered  Take Vital Signs  Increase Vital Sign Frequency  Yellow: Q2hr x1, continue Q4hrs until patient remains green for 12hrs  Escalate  MEWS: Escalate Yellow: Discuss with charge nurse and consider notifying provider and/or RRT  Notify: Charge Nurse/RN  Name of Charge Nurse/RN Notified Tawni Hurst, RN  Provider Notification  Provider Name/Title Dr. Marcene  Date Provider Notified 04/08/24  Time Provider Notified 2232  Method of Notification Page  Notification Reason New onset of dysrhythmia (RVR- patient has hx of RVR that she takes extra 1/2 metoprolol  for prn)  Provider response See new orders  Date of Provider Response 04/08/24  Time of Provider Response 2240  Assess: SIRS CRITERIA  SIRS Temperature  0  SIRS Respirations  0  SIRS Pulse 1  SIRS WBC 0  SIRS Score Sum  1

## 2024-04-08 NOTE — ED Notes (Signed)
 PT desatted to 78% on room air,  pt placed on 4L with rebound to 95%,  pt denies needs at this time

## 2024-04-08 NOTE — Progress Notes (Signed)
 Subjective/Chief Complaint: Pt doing well with no c/o this PM Denies SOB   Objective: Vital signs in last 24 hours: Temp:  [97.4 F (36.3 C)-98.5 F (36.9 C)] 98.1 F (36.7 C) (12/14 1553) Pulse Rate:  [68-96] 85 (12/14 1557) Resp:  [8-37] 20 (12/14 1553) BP: (112-208)/(71-95) 164/73 (12/14 1652) SpO2:  [90 %-100 %] 100 % (12/14 1557) Weight:  [60.7 kg] (P) 60.7 kg (12/14 1603)    Intake/Output from previous day: No intake/output data recorded. Intake/Output this shift: No intake/output data recorded.  PE:  Constitutional: No acute distress, conversant, appears states age. Eyes: Anicteric sclerae, moist conjunctiva, no lid lag Lungs: Clear to auscultation bilaterally, normal respiratory effort CV: regular rate and rhythm, no murmurs, no peripheral edema, pedal pulses 2+ GI: Soft, no masses or hepatosplenomegaly, non-tender to palpation Skin: No rashes, palpation reveals normal turgor Psychiatric: appropriate judgment and insight, oriented to person, place, and time   Lab Results:  Recent Labs    04/07/24 1525 04/07/24 1537 04/08/24 0630  WBC 6.7  --  4.9  HGB 15.1* 16.0* 13.6  HCT 46.2* 47.0* 41.3  PLT 181  --  159   BMET Recent Labs    04/07/24 1525 04/07/24 1537 04/08/24 0630  NA 140 138 136  K 3.9 4.5 3.9  CL 101 101 100  CO2 27  --  26  GLUCOSE 112* 114* 126*  BUN 19 27* 17  CREATININE 0.66 0.60 0.55  CALCIUM  9.6  --  9.0   PT/INR Recent Labs    04/07/24 1525  LABPROT 13.6  INR 1.0   ABG No results for input(s): PHART, HCO3 in the last 72 hours.  Invalid input(s): PCO2, PO2  Studies/Results: CT CHEST ABDOMEN PELVIS W CONTRAST Result Date: 04/07/2024 CLINICAL DATA:  Patient is status post fall with known multiple rib fractures and LEFT pneumothorax EXAM: CT CHEST, ABDOMEN, AND PELVIS WITH CONTRAST TECHNIQUE: Multidetector CT imaging of the chest, abdomen and pelvis was performed following the standard protocol during bolus  administration of intravenous contrast. RADIATION DOSE REDUCTION: This exam was performed according to the departmental dose-optimization program which includes automated exposure control, adjustment of the mA and/or kV according to patient size and/or use of iterative reconstruction technique. CONTRAST:  OMNIPAQUE  IOHEXOL  300 MG/ML  SOLN COMPARISON:  Same day radiograph.  December 15, 2019 FINDINGS: CT CHEST FINDINGS Cardiovascular: Heart is normal in size. No pericardial effusion. Atherosclerotic calcifications of the nonaneurysmal abdominal aorta. Mediastinum/Nodes: Visualized thyroid is unremarkable. No axillary or mediastinal adenopathy. Lungs/Pleura: There is a small to moderate LEFT-sided pneumothorax. There is a small to moderate pleural effusion which measures slightly higher density than simple fluid and likely reflects a degree of hemothorax. Moderate centrilobular emphysema. Near complete atelectasis of the lingula and LEFT lower lobe. Musculoskeletal: There are multiple LEFT-sided rib fractures involving the anterolateral fifth through eighth ribs. There is adjacent periosteal reaction suggesting subacute nature. There are multiple fractures of the sixth rib laterally. There is an additional nondisplaced fracture of the of the posterior seventh and eighth ribs. There is a comminuted fracture involving the posterolateral ninth rib with adjacent periosteal reaction. There are at least 3 fractures of the eighth rib. CT ABDOMEN PELVIS FINDINGS Hepatobiliary: Low-density of the liver diffusely. Gallbladder is unremarkable. Subcentimeter hypodense lesions are too small to accurately characterize. Pancreas: Unremarkable. No pancreatic ductal dilatation or surrounding inflammatory changes. Spleen: Normal in size without focal abnormality. Adrenals/Urinary Tract: Adrenal glands are unremarkable. Kidneys enhance symmetrically. No hydronephrosis. No obstructing nephrolithiasis. Bladder  is obscured by metallic  streak artifact. Stomach/Bowel: No evidence of bowel obstruction. Appendiceal candidate is unremarkable. No ancillary evidence of appendicitis. Small hiatal hernia. Vascular/Lymphatic: Sclerotic calcifications of the nonaneurysmal abdominal aorta. No suspicious lymphadenopathy visualized. Circumaortic LEFT renal vein. Reproductive: Pelvis is obscured by streak artifact. Other: No free air. Musculoskeletal: Status post RIGHT hip arthroplasty. Predominately facet arthropathy of the lumbar spine. No acute fracture or static subluxation of the lumbar spine. IMPRESSION: 1. There are multiple subacute appearing LEFT-sided rib fractures involving the fifth through ninth ribs, several which are fractured in multiple locations. There are at least 3 fractures involving the eighth rib. 2. There is a small to moderate LEFT-sided pneumothorax. There is a small to moderate LEFT pleural effusion which measures slightly higher density than simple fluid and likely reflects a degree of hemothorax. 3. Near complete atelectasis of the lingula and LEFT lower lobe. 4. No acute traumatic injury within the abdomen or pelvis. 5. Favored underlying hepatic steatosis. Aortic Atherosclerosis (ICD10-I70.0) and Emphysema (ICD10-J43.9). Electronically Signed   By: Corean Salter M.D.   On: 04/07/2024 17:28   CT CERVICAL SPINE WO CONTRAST Result Date: 04/07/2024 EXAM: CT CERVICAL SPINE WITHOUT CONTRAST 04/07/2024 04:51:22 PM TECHNIQUE: CT of the cervical spine was performed without the administration of intravenous contrast. Multiplanar reformatted images are provided for review. Automated exposure control, iterative reconstruction, and/or weight based adjustment of the mA/kV was utilized to reduce the radiation dose to as low as reasonably achievable. COMPARISON: None available. CLINICAL HISTORY: Polytrauma, blunt. Fall before Thanksgiving. FINDINGS: BONES AND ALIGNMENT: Normal alignment. No acute fracture or suspicious lesion.  DEGENERATIVE CHANGES: Moderate atlantodental degenerative changes. Mild cervical spondylosis and moderately advanced facet arthrosis. No evidence of high grade spinal canal stenosis. SOFT TISSUES: No prevertebral soft tissue swelling. The chest is reported separately. IMPRESSION: 1. No acute cervical spine fracture or traumatic malalignment. Electronically signed by: Dasie Hamburg MD 04/07/2024 05:03 PM EST RP Workstation: [id number]   CT HEAD WO CONTRAST Result Date: 04/07/2024 EXAM: CT HEAD WITHOUT 04/07/2024 04:51:22 PM TECHNIQUE: CT of the head was performed without the administration of intravenous contrast. Automated exposure control, iterative reconstruction, and/or weight based adjustment of the mA/kV was utilized to reduce the radiation dose to as low as reasonably achievable. COMPARISON: None available. CLINICAL HISTORY: Head trauma, moderate-severe. Fall before thanksgiving. FINDINGS: BRAIN AND VENTRICLES: There is no evidence of an acute infarct, intracranial hemorrhage, mass, midline shift, hydrocephalus, or extra-axial fluid collection. There is mild generalized cerebral atrophy. Cerebral white matter hypodensities are nonspecific but compatible with mild chronic small vessel ischemic disease. ORBITS: Bilateral cataract extraction. SINUSES AND MASTOIDS: Mucosal thickening in the left sphenoid sinus. Clear mastoid air cells. SOFT TISSUES AND SKULL: No acute skull fracture. No acute soft tissue abnormality. IMPRESSION: 1. No acute intracranial abnormality. 2. Mild chronic small vessel ischemic disease. Electronically signed by: Dasie Hamburg MD 04/07/2024 04:59 PM EST RP Workstation: [id number]    Anti-infectives: Anti-infectives (From admission, onward)    None       Assessment/Plan: 31F s/p fall x 2weeks ago H/o Afib on eliquis  L PTX L hemothorax L 5-8 rib fx  Con't with pain control and pulm toilet No IS at the bedside.  I d/w nurse at the bedside. PT to see   LOS: 1 day     Lynda Leos 04/08/2024

## 2024-04-08 NOTE — Progress Notes (Signed)
 [facility] PROGRESS NOTE    Progress Note  Katrina Oliver  FMW:[id number] DOB: 1945/07/19 DOA: 04/07/2024 PCP: Chrystal Lamarr RAMAN, MD     Brief Narrative:   Katrina Oliver is an 78 y.o. female past medical history of atrial fibrillation on Eliquis , essential hypertension comes in complaining of left-sided pain, with a history of fall over the Thanksgiving weekend with trauma to the left side, during that evaluation workup was unremarkable patient was sent home from urgent care with supportive measures.,  She relates he started having left-sided pain and shortness of breath for about 5 days prior to admission persistent.  Went to saw her PCP who did multiple images and after results came back she was sent to the ED for further evaluation.  She relates uncontrolled pain and some shortness of breath  Significant studies: - 04/07/2024 CT of the chest abdomen pelvis: Shows subacute left sided rib fracture fifth through 9, with 3 rib fractures involving the eighth rib on the left side, small to moderate left-sided pneumothorax and a small moderate left-sided pleural effusion concerning about hemothorax. - 04/07/2024 CT of the head: Showed no acute findings. - 04/07/2024 CT Cervical spine: Showed no acute fracture or dislocation.  Antibiotics: None  Microbiology data: Blood culture:  Procedures: None  Assessment/Plan:   Mechanical fall leading to multiple rib fractures fifth through 9, eighth rib has 3 fractures/ Hemopneumothorax on left: General Surgery at [facility] was consulted who recommended to hold Eliquis  and transfer to [facility] for trauma consult and chest tube as needed.  They also recommended supportive care with pulmonary toileting and continue oxygen. Continue narcotics for pain control will start on a bowel regimen. Hopefully trauma can take over this patient. PT OT eval. Per surgery no acute interventions required immediately at this time.  Primary  hypertension Question due to pain resume home regimen.  Chronic atrial fibrillation (HCC) Rate controlled metoprolol  hold Eliquis  due to possible hemothorax.  Hyperlipidemia:  continue Crestor .   DVT prophylaxis: SCD Family Communication:none Status is: Inpatient Remains inpatient appropriate because: Multiple rib fractures with possible hemothorax    Code Status:     Code Status Orders  (From admission, onward)           Start     Ordered   04/07/24 2317  Full code  Continuous       Question:  By:  Answer:  Default: patient does not have capacity for decision making, no surrogate or prior directive available   04/07/24 2318           Code Status History     Date Active Date Inactive Code Status Order ID Comments User Context   07/29/2021 2032 07/30/2021 2113 Full Code [id number]  Fidel Rogue, MD Inpatient         IV Access:   Peripheral IV   Procedures and diagnostic studies:   CT CHEST ABDOMEN PELVIS W CONTRAST Result Date: 04/07/2024 CLINICAL DATA:  Patient is status post fall with known multiple rib fractures and LEFT pneumothorax EXAM: CT CHEST, ABDOMEN, AND PELVIS WITH CONTRAST TECHNIQUE: Multidetector CT imaging of the chest, abdomen and pelvis was performed following the standard protocol during bolus administration of intravenous contrast. RADIATION DOSE REDUCTION: This exam was performed according to the departmental dose-optimization program which includes automated exposure control, adjustment of the mA and/or kV according to patient size and/or use of iterative reconstruction technique. CONTRAST:  OMNIPAQUE  IOHEXOL  300 MG/ML  SOLN COMPARISON:  Same day radiograph.  December 15, 2019 FINDINGS: CT CHEST FINDINGS Cardiovascular: Heart is normal in size. No pericardial effusion. Atherosclerotic calcifications of the nonaneurysmal abdominal aorta. Mediastinum/Nodes: Visualized thyroid is unremarkable. No axillary or mediastinal adenopathy.  Lungs/Pleura: There is a small to moderate LEFT-sided pneumothorax. There is a small to moderate pleural effusion which measures slightly higher density than simple fluid and likely reflects a degree of hemothorax. Moderate centrilobular emphysema. Near complete atelectasis of the lingula and LEFT lower lobe. Musculoskeletal: There are multiple LEFT-sided rib fractures involving the anterolateral fifth through eighth ribs. There is adjacent periosteal reaction suggesting subacute nature. There are multiple fractures of the sixth rib laterally. There is an additional nondisplaced fracture of the of the posterior seventh and eighth ribs. There is a comminuted fracture involving the posterolateral ninth rib with adjacent periosteal reaction. There are at least 3 fractures of the eighth rib. CT ABDOMEN PELVIS FINDINGS Hepatobiliary: Low-density of the liver diffusely. Gallbladder is unremarkable. Subcentimeter hypodense lesions are too small to accurately characterize. Pancreas: Unremarkable. No pancreatic ductal dilatation or surrounding inflammatory changes. Spleen: Normal in size without focal abnormality. Adrenals/Urinary Tract: Adrenal glands are unremarkable. Kidneys enhance symmetrically. No hydronephrosis. No obstructing nephrolithiasis. Bladder is obscured by metallic streak artifact. Stomach/Bowel: No evidence of bowel obstruction. Appendiceal candidate is unremarkable. No ancillary evidence of appendicitis. Small hiatal hernia. Vascular/Lymphatic: Sclerotic calcifications of the nonaneurysmal abdominal aorta. No suspicious lymphadenopathy visualized. Circumaortic LEFT renal vein. Reproductive: Pelvis is obscured by streak artifact. Other: No free air. Musculoskeletal: Status post RIGHT hip arthroplasty. Predominately facet arthropathy of the lumbar spine. No acute fracture or static subluxation of the lumbar spine. IMPRESSION: 1. There are multiple subacute appearing LEFT-sided rib fractures involving the  fifth through ninth ribs, several which are fractured in multiple locations. There are at least 3 fractures involving the eighth rib. 2. There is a small to moderate LEFT-sided pneumothorax. There is a small to moderate LEFT pleural effusion which measures slightly higher density than simple fluid and likely reflects a degree of hemothorax. 3. Near complete atelectasis of the lingula and LEFT lower lobe. 4. No acute traumatic injury within the abdomen or pelvis. 5. Favored underlying hepatic steatosis. Aortic Atherosclerosis (ICD10-I70.0) and Emphysema (ICD10-J43.9). Electronically Signed   By: Corean Salter M.D.   On: 04/07/2024 17:28   CT CERVICAL SPINE WO CONTRAST Result Date: 04/07/2024 EXAM: CT CERVICAL SPINE WITHOUT CONTRAST 04/07/2024 04:51:22 PM TECHNIQUE: CT of the cervical spine was performed without the administration of intravenous contrast. Multiplanar reformatted images are provided for review. Automated exposure control, iterative reconstruction, and/or weight based adjustment of the mA/kV was utilized to reduce the radiation dose to as low as reasonably achievable. COMPARISON: None available. CLINICAL HISTORY: Polytrauma, blunt. Fall before Thanksgiving. FINDINGS: BONES AND ALIGNMENT: Normal alignment. No acute fracture or suspicious lesion. DEGENERATIVE CHANGES: Moderate atlantodental degenerative changes. Mild cervical spondylosis and moderately advanced facet arthrosis. No evidence of high grade spinal canal stenosis. SOFT TISSUES: No prevertebral soft tissue swelling. The chest is reported separately. IMPRESSION: 1. No acute cervical spine fracture or traumatic malalignment. Electronically signed by: Dasie Hamburg MD 04/07/2024 05:03 PM EST RP Workstation: [id number]   CT HEAD WO CONTRAST Result Date: 04/07/2024 EXAM: CT HEAD WITHOUT 04/07/2024 04:51:22 PM TECHNIQUE: CT of the head was performed without the administration of intravenous contrast. Automated exposure control, iterative  reconstruction, and/or weight based adjustment of the mA/kV was utilized to reduce the radiation dose to as low as reasonably achievable. COMPARISON: None available. CLINICAL HISTORY: Head trauma, moderate-severe. Fall before thanksgiving. FINDINGS:  BRAIN AND VENTRICLES: There is no evidence of an acute infarct, intracranial hemorrhage, mass, midline shift, hydrocephalus, or extra-axial fluid collection. There is mild generalized cerebral atrophy. Cerebral white matter hypodensities are nonspecific but compatible with mild chronic small vessel ischemic disease. ORBITS: Bilateral cataract extraction. SINUSES AND MASTOIDS: Mucosal thickening in the left sphenoid sinus. Clear mastoid air cells. SOFT TISSUES AND SKULL: No acute skull fracture. No acute soft tissue abnormality. IMPRESSION: 1. No acute intracranial abnormality. 2. Mild chronic small vessel ischemic disease. Electronically signed by: Dasie Hamburg MD 04/07/2024 04:59 PM EST RP Workstation: [id number]     Medical Consultants:   None.   Subjective:    Katrina Oliver she relates her pain is controlled last bowel movement was 2 days prior to admission.  Objective:    Vitals:   04/08/24 0427 04/08/24 0430 04/08/24 0700 04/08/24 0723  BP:  (!) 171/78 (!) 170/75   Pulse: 85 76 75   Resp: 16 14 18    Temp:    (!) 97.4 F (36.3 C)  TempSrc:    Oral  SpO2: 100% 100% 100%    SpO2: 100 % O2 Flow Rate (L/min): 10 L/min  No intake or output data in the 24 hours ending 04/08/24 0738 There were no vitals filed for this visit.  Exam: General exam: In no acute distress. Respiratory system: Good air movement and clear to auscultation. Cardiovascular system: S1 & S2 heard, RRR. No JVD. Gastrointestinal system: Abdomen is nondistended, soft and nontender.  Extremities: No pedal edema. Skin: No rashes, lesions or ulcers, cannot appreciate hematoma Psychiatry: Judgement and insight appear normal. Mood & affect appropriate.    Data  Reviewed:    Labs: Basic Metabolic Panel: Recent Labs  Lab 04/07/24 1525 04/07/24 1537 04/08/24 0630  NA 140 138 136  K 3.9 4.5 3.9  CL 101 101 100  CO2 27  --  26  GLUCOSE 112* 114* 126*  BUN 19 27* 17  CREATININE 0.66 0.60 0.55  CALCIUM  9.6  --  9.0   GFR Estimated Creatinine Clearance: 46.2 mL/min (by C-G formula based on SCr of 0.55 mg/dL). Liver Function Tests: Recent Labs  Lab 04/07/24 1525 04/08/24 0630  AST 25 22  ALT 15 12  ALKPHOS 93 82  BILITOT 0.7 1.1  PROT 7.2 6.7  ALBUMIN 4.4 4.0   No results for input(s): LIPASE, AMYLASE in the last 168 hours. No results for input(s): AMMONIA in the last 168 hours. Coagulation profile Recent Labs  Lab 04/07/24 1525  INR 1.0   COVID-19 Labs  No results for input(s): DDIMER, FERRITIN, LDH, CRP in the last 72 hours.  No results found for: SARSCOV2NAA  CBC: Recent Labs  Lab 04/07/24 1525 04/07/24 1537 04/08/24 0630  WBC 6.7  --  4.9  HGB 15.1* 16.0* 13.6  HCT 46.2* 47.0* 41.3  MCV 102.7*  --  102.7*  PLT 181  --  159   Cardiac Enzymes: No results for input(s): CKTOTAL, CKMB, CKMBINDEX, TROPONINI in the last 168 hours. BNP (last 3 results) No results for input(s): PROBNP in the last 8760 hours. CBG: No results for input(s): GLUCAP in the last 168 hours. D-Dimer: No results for input(s): DDIMER in the last 72 hours. Hgb A1c: No results for input(s): HGBA1C in the last 72 hours. Lipid Profile: No results for input(s): CHOL, HDL, LDLCALC, TRIG, CHOLHDL, LDLDIRECT in the last 72 hours. Thyroid function studies: No results for input(s): TSH, T4TOTAL, T3FREE, THYROIDAB in the last 72 hours.  Invalid  input(s): FREET3 Anemia work up: No results for input(s): VITAMINB12, FOLATE, FERRITIN, TIBC, IRON, RETICCTPCT in the last 72 hours. Sepsis Labs: Recent Labs  Lab 04/07/24 1525 04/07/24 1537 04/08/24 0630  WBC 6.7  --  4.9  LATICACIDVEN   --  1.3  --    Microbiology No results found for this or any previous visit (from the past 240 hours).   Medications:    loratadine   10 mg Oral Daily   metoprolol  tartrate  25 mg Oral BID   multivitamin with minerals  1 tablet Oral Daily   rosuvastatin   10 mg Oral QHS   Continuous Infusions:    LOS: 1 day   Katrina Oliver  [facility]  04/08/2024, 7:38 AM

## 2024-04-08 NOTE — Progress Notes (Signed)
 Patient's heart rate 100s-160s while lying in bed.  Patient denies any complaints other than can feel heart racing.  Has had pm metoprolol .  BP 161/73, oxygen sat 96-97 % on room air.  Dr. Marcene notified.

## 2024-04-08 NOTE — Progress Notes (Signed)

## 2024-04-08 NOTE — Progress Notes (Signed)
 RT holding all CPT at this time due to being on NRB for treatment of Hemopneumothorax. Flutter would require removing mask, deep breathing (potentially adding more air to injury). PT currently has ribs fractures 5-9 therefore chest vest is not a solution. ED bed does not perform CPT. As condition is treated we may be able to add CPT in to her care plan.

## 2024-04-08 NOTE — H&P (Incomplete)
 History and Physical    Katrina Oliver FMW:[id number] DOB: 05-20-1945 DOA: 04/07/2024  PCP: Chrystal Lamarr RAMAN, MD  Patient coming from: home  I have personally briefly reviewed patient's old medical records in [facility]  Chief Complaint: Left sided chest pain  HPI: Katrina Oliver is a 78 y.o. female with medical history significant of Atrial fibrillation on Eliquis ,HTN, HLD, who presents to ED with complaint of left sided chest pain. Patient has interim history of fall the weekend of Thanksgiving with with trauma to left side. Patient was seen for her injury and cxr was completed which was unremarkable and patient was sent home from Urgent cared with supportive measures.  Patient however has persistent symptoms and presented to pcp clinic and was found to have fx ribs/pneumothorax and hemothorax on repeat imaging. Due to this patient was referred to ED for further evaluation.   ED Course:  Vitals:  Review of Systems: As per HPI otherwise 10 point review of systems negative.   Past Medical History:  Diagnosis Date   A-fib [facility])    Aortic atherosclerosis    Dysrhythmia    Hip pain    HTN (hypertension)    Hypercholesteremia    Osteoarthritis    PAF (paroxysmal atrial fibrillation) (HCC)     Past Surgical History:  Procedure Laterality Date   RIGHT OOPHORECTOMY  2006   TOTAL HIP ARTHROPLASTY Right 07/29/2021   Procedure: TOTAL HIP ARTHROPLASTY ANTERIOR APPROACH;  Surgeon: Fidel Rogue, MD;  Location: [facility]S;  Service: Orthopedics;  Laterality: Right;  150   TUBAL LIGATION  1982     reports that she quit smoking about 19 years ago. Her smoking use included cigarettes. She started smoking about 49 years ago. She has a 7.5 pack-year smoking history. She has never used smokeless tobacco. She reports current alcohol  use of about 12.0 standard drinks of alcohol  per week. She reports that she does not use drugs.  Allergies[1]  No family history on  file. *** Prior to Admission medications  Medication Sig Start Date End Date Taking? Authorizing Provider  acetaminophen  (TYLENOL ) 500 MG tablet Take one as needed as directed Indications: pain    [provider]  apixaban  (ELIQUIS ) 5 MG TABS tablet Take one tablet by mouth twice daily 05/23/23   Terra Fairy PARAS, PA-C  Calcium  Carbonate-Vitamin D  600-3.125 MG-MCG TABS Take 1 tablet by mouth daily.    [provider]  cetirizine (ZYRTEC) 10 MG tablet Take 10 mg by mouth daily. Patient taking differently: Take 10 mg by mouth as needed.    [provider]  Estradiol 10 MCG TABS vaginal tablet Place 1 tablet vaginally 2 (two) times a week.    [provider]  fluticasone  (FLONASE ) 50 MCG/ACT nasal spray Place 1 spray into both nostrils as needed for allergies or rhinitis.    [provider]  Magnesium  250 MG TABS Take 250 mg by mouth daily.    [provider]  metoprolol  tartrate (LOPRESSOR ) 25 MG tablet TAKE 1 TABLET TWICE A DAY 05/23/23   Terra Fairy PARAS, PA-C  metroNIDAZOLE (METROGEL) 0.75 % gel Apply 1 application. topically daily.    [provider]  Multiple Vitamin (MULTIVITAMIN) tablet Take 1 tablet by mouth daily.    [provider]  Multiple Vitamins-Minerals (VITAMIN-MINERAL SUPPLEMENT PO) Name: Advanced Collagen: Take one every day for supplement.    [provider]  Probiotic Product ([employer] DAILY PROBIOTIC PO) Take 1 capsule by mouth daily. 04/27/19  [provider]  Propylene Glycol (SYSTANE COMPLETE) 0.6 % SOLN Place 1 drop into both eyes 2 (two) times daily as needed (dry eyes).    [provider]  rosuvastatin  (CRESTOR ) 10 MG tablet Take 10 mg by mouth at bedtime. 04/01/21   [provider]    Physical Exam: Vitals:   04/07/24 1700 04/07/24 1730 04/07/24 1830 04/07/24 1833  BP: (!) 221/80 (!) 181/73 (!) 165/69   Pulse: (!) 104 74 69 66  Resp: 18 18 18    Temp:    98.6 F  (37 C)  TempSrc:    Oral  SpO2: 97% 100% 100% 100%    Constitutional: NAD, calm, comfortable Vitals:   04/07/24 1700 04/07/24 1730 04/07/24 1830 04/07/24 1833  BP: (!) 221/80 (!) 181/73 (!) 165/69   Pulse: (!) 104 74 69 66  Resp: 18 18 18    Temp:    98.6 F (37 C)  TempSrc:    Oral  SpO2: 97% 100% 100% 100%   Eyes: PERRL, lids and conjunctivae normal ENMT: Mucous membranes are moist. Posterior pharynx clear of any exudate or lesions.Normal dentition.  Neck: normal, supple, no masses, no thyromegaly Respiratory: clear to auscultation bilaterally, no wheezing, no crackles. Normal respiratory effort. No accessory muscle use.  Cardiovascular: Regular rate and rhythm, no murmurs / rubs / gallops. No extremity edema. 2+ pedal pulses. No carotid bruits.  Abdomen: no tenderness, no masses palpated. No hepatosplenomegaly. Bowel sounds positive.  Musculoskeletal: no clubbing / cyanosis. No joint deformity upper and lower extremities. Good ROM, no contractures. Normal muscle tone.  Skin: no rashes, lesions, ulcers. No induration Neurologic: CN 2-12 grossly intact. Sensation intact, DTR normal. Strength 5/5 in all 4.  Psychiatric: Normal judgment and insight. Alert and oriented x 3. Normal mood.    Labs on Admission: I have personally reviewed following labs and imaging studies  CBC: Recent Labs  Lab 04/07/24 1525 04/07/24 1537  WBC 6.7  --   HGB 15.1* 16.0*  HCT 46.2* 47.0*  MCV 102.7*  --   PLT 181  --    Basic Metabolic Panel: Recent Labs  Lab 04/07/24 1525 04/07/24 1537  NA 140 138  K 3.9 4.5  CL 101 101  CO2 27  --   GLUCOSE 112* 114*  BUN 19 27*  CREATININE 0.66 0.60  CALCIUM  9.6  --    GFR: Estimated Creatinine Clearance: 46.2 mL/min (by C-G formula based on SCr of 0.6 mg/dL). Liver Function Tests: Recent Labs  Lab 04/07/24 1525  AST 25  ALT 15  ALKPHOS 93  BILITOT 0.7  PROT 7.2  ALBUMIN 4.4   No results for input(s): LIPASE, AMYLASE in the last 168  hours. No results for input(s): AMMONIA in the last 168 hours. Coagulation Profile: Recent Labs  Lab 04/07/24 1525  INR 1.0   Cardiac Enzymes: No results for input(s): CKTOTAL, CKMB, CKMBINDEX, TROPONINI in the last 168 hours. BNP (last 3 results) No results for input(s): PROBNP in the last 8760 hours. HbA1C: No results for input(s): HGBA1C in the last 72 hours. CBG: No results for input(s): GLUCAP in the last 168 hours. Lipid Profile: No results for input(s): CHOL, HDL, LDLCALC, TRIG, CHOLHDL, LDLDIRECT in the last 72 hours. Thyroid Function Tests: No results for input(s): TSH, T4TOTAL, FREET4, T3FREE, THYROIDAB in the last 72 hours. Anemia Panel: No results for input(s): VITAMINB12, FOLATE, FERRITIN, TIBC, IRON, RETICCTPCT in the last 72 hours. Urine analysis:    Component Value Date/Time   COLORURINE YELLOW  04/07/2024 1629   APPEARANCEUR CLEAR 04/07/2024 1629   LABSPEC 1.024 04/07/2024 1629   PHURINE 5.0 04/07/2024 1629   GLUCOSEU NEGATIVE 04/07/2024 1629   HGBUR SMALL (A) 04/07/2024 1629   BILIRUBINUR NEGATIVE 04/07/2024 1629   KETONESUR 80 (A) 04/07/2024 1629   PROTEINUR NEGATIVE 04/07/2024 1629   NITRITE NEGATIVE 04/07/2024 1629   LEUKOCYTESUR TRACE (A) 04/07/2024 1629    Radiological Exams on Admission: CT CHEST ABDOMEN PELVIS W CONTRAST Result Date: 04/07/2024 CLINICAL DATA:  Patient is status post fall with known multiple rib fractures and LEFT pneumothorax EXAM: CT CHEST, ABDOMEN, AND PELVIS WITH CONTRAST TECHNIQUE: Multidetector CT imaging of the chest, abdomen and pelvis was performed following the standard protocol during bolus administration of intravenous contrast. RADIATION DOSE REDUCTION: This exam was performed according to the departmental dose-optimization program which includes automated exposure control, adjustment of the mA and/or kV according to patient size and/or use of iterative reconstruction  technique. CONTRAST:  OMNIPAQUE  IOHEXOL  300 MG/ML  SOLN COMPARISON:  Same day radiograph.  December 15, 2019 FINDINGS: CT CHEST FINDINGS Cardiovascular: Heart is normal in size. No pericardial effusion. Atherosclerotic calcifications of the nonaneurysmal abdominal aorta. Mediastinum/Nodes: Visualized thyroid is unremarkable. No axillary or mediastinal adenopathy. Lungs/Pleura: There is a small to moderate LEFT-sided pneumothorax. There is a small to moderate pleural effusion which measures slightly higher density than simple fluid and likely reflects a degree of hemothorax. Moderate centrilobular emphysema. Near complete atelectasis of the lingula and LEFT lower lobe. Musculoskeletal: There are multiple LEFT-sided rib fractures involving the anterolateral fifth through eighth ribs. There is adjacent periosteal reaction suggesting subacute nature. There are multiple fractures of the sixth rib laterally. There is an additional nondisplaced fracture of the of the posterior seventh and eighth ribs. There is a comminuted fracture involving the posterolateral ninth rib with adjacent periosteal reaction. There are at least 3 fractures of the eighth rib. CT ABDOMEN PELVIS FINDINGS Hepatobiliary: Low-density of the liver diffusely. Gallbladder is unremarkable. Subcentimeter hypodense lesions are too small to accurately characterize. Pancreas: Unremarkable. No pancreatic ductal dilatation or surrounding inflammatory changes. Spleen: Normal in size without focal abnormality. Adrenals/Urinary Tract: Adrenal glands are unremarkable. Kidneys enhance symmetrically. No hydronephrosis. No obstructing nephrolithiasis. Bladder is obscured by metallic streak artifact. Stomach/Bowel: No evidence of bowel obstruction. Appendiceal candidate is unremarkable. No ancillary evidence of appendicitis. Small hiatal hernia. Vascular/Lymphatic: Sclerotic calcifications of the nonaneurysmal abdominal aorta. No suspicious lymphadenopathy  visualized. Circumaortic LEFT renal vein. Reproductive: Pelvis is obscured by streak artifact. Other: No free air. Musculoskeletal: Status post RIGHT hip arthroplasty. Predominately facet arthropathy of the lumbar spine. No acute fracture or static subluxation of the lumbar spine. IMPRESSION: 1. There are multiple subacute appearing LEFT-sided rib fractures involving the fifth through ninth ribs, several which are fractured in multiple locations. There are at least 3 fractures involving the eighth rib. 2. There is a small to moderate LEFT-sided pneumothorax. There is a small to moderate LEFT pleural effusion which measures slightly higher density than simple fluid and likely reflects a degree of hemothorax. 3. Near complete atelectasis of the lingula and LEFT lower lobe. 4. No acute traumatic injury within the abdomen or pelvis. 5. Favored underlying hepatic steatosis. Aortic Atherosclerosis (ICD10-I70.0) and Emphysema (ICD10-J43.9). Electronically Signed   By: Corean Salter M.D.   On: 04/07/2024 17:28   CT CERVICAL SPINE WO CONTRAST Result Date: 04/07/2024 EXAM: CT CERVICAL SPINE WITHOUT CONTRAST 04/07/2024 04:51:22 PM TECHNIQUE: CT of the cervical spine was performed without the administration of intravenous contrast. Multiplanar  reformatted images are provided for review. Automated exposure control, iterative reconstruction, and/or weight based adjustment of the mA/kV was utilized to reduce the radiation dose to as low as reasonably achievable. COMPARISON: None available. CLINICAL HISTORY: Polytrauma, blunt. Fall before Thanksgiving. FINDINGS: BONES AND ALIGNMENT: Normal alignment. No acute fracture or suspicious lesion. DEGENERATIVE CHANGES: Moderate atlantodental degenerative changes. Mild cervical spondylosis and moderately advanced facet arthrosis. No evidence of high grade spinal canal stenosis. SOFT TISSUES: No prevertebral soft tissue swelling. The chest is reported separately. IMPRESSION: 1. No  acute cervical spine fracture or traumatic malalignment. Electronically signed by: Dasie Hamburg MD 04/07/2024 05:03 PM EST RP Workstation: [id number]   CT HEAD WO CONTRAST Result Date: 04/07/2024 EXAM: CT HEAD WITHOUT 04/07/2024 04:51:22 PM TECHNIQUE: CT of the head was performed without the administration of intravenous contrast. Automated exposure control, iterative reconstruction, and/or weight based adjustment of the mA/kV was utilized to reduce the radiation dose to as low as reasonably achievable. COMPARISON: None available. CLINICAL HISTORY: Head trauma, moderate-severe. Fall before thanksgiving. FINDINGS: BRAIN AND VENTRICLES: There is no evidence of an acute infarct, intracranial hemorrhage, mass, midline shift, hydrocephalus, or extra-axial fluid collection. There is mild generalized cerebral atrophy. Cerebral white matter hypodensities are nonspecific but compatible with mild chronic small vessel ischemic disease. ORBITS: Bilateral cataract extraction. SINUSES AND MASTOIDS: Mucosal thickening in the left sphenoid sinus. Clear mastoid air cells. SOFT TISSUES AND SKULL: No acute skull fracture. No acute soft tissue abnormality. IMPRESSION: 1. No acute intracranial abnormality. 2. Mild chronic small vessel ischemic disease. Electronically signed by: Dasie Hamburg MD 04/07/2024 04:59 PM EST RP Workstation: [id number]    EKG: Independently reviewed. ***  Assessment/Plan Active Problems:   * No active hospital problems. *   ***  DVT prophylaxis: *** (Lovenox/Heparin/SCD's/anticoagulated/None (if comfort care) Code Status: *** (Full/Partial (specify details) Family Communication: *** (Specify name, relationship. Do not write discussed with patient. Specify tel # if discussed over the phone) Disposition Plan: *** (specify when and where you expect patient to be discharged) Consults called: *** (with names) Admission status: *** (inpatient / obs / tele / medical floor / SDU)   Camila DELENA Ned MD [facility] Pager [phone]  If 7PM-7AM, please contact night-coverage [email or web address] Password [email or web address]  04/07/2024, 9:46 PM          [1] Allergies Allergen Reactions   Other Other (See Comments)    alloway eye drops   Alaway [Ketotifen Fumarate]     Eye drops - causes eye irritation, redness and swelling    Pollen Extract     Trees, Grass

## 2024-04-09 ENCOUNTER — Inpatient Hospital Stay (HOSPITAL_COMMUNITY)

## 2024-04-09 LAB — BASIC METABOLIC PANEL WITH GFR
Anion gap: 18 — ABNORMAL HIGH (ref 5–15)
BUN: 16 mg/dL (ref 8–23)
CO2: 22 mmol/L (ref 22–32)
Calcium: 9.6 mg/dL (ref 8.9–10.3)
Chloride: 100 mmol/L (ref 98–111)
Creatinine, Ser: 1.06 mg/dL — ABNORMAL HIGH (ref 0.44–1.00)
GFR, Estimated: 53 mL/min — ABNORMAL LOW (ref >60)
Glucose, Bld: 126 mg/dL — ABNORMAL HIGH (ref 70–99)
Potassium: 3.9 mmol/L (ref 3.5–5.1)
Sodium: 140 mmol/L (ref 135–145)

## 2024-04-09 LAB — CBC
HCT: 43.4 % (ref 36.0–46.0)
Hemoglobin: 14.7 g/dL (ref 12.0–15.0)
MCH: 34.2 pg — ABNORMAL HIGH (ref 26.0–34.0)
MCHC: 33.9 g/dL (ref 30.0–36.0)
MCV: 100.9 fL — ABNORMAL HIGH (ref 80.0–100.0)
Platelets: 199 K/uL (ref 150–400)
RBC: 4.3 MIL/uL (ref 3.87–5.11)
RDW: 13 % (ref 11.5–15.5)
WBC: 4.7 K/uL (ref 4.0–10.5)
nRBC: 0 % (ref 0.0–0.2)

## 2024-04-09 LAB — MAGNESIUM: Magnesium: 2.1 mg/dL (ref 1.7–2.4)

## 2024-04-09 MED ORDER — LIDOCAINE 5 % EX PTCH
1.0000 | MEDICATED_PATCH | CUTANEOUS | Status: DC
  Administered 2024-04-09 – 2024-04-10 (×2): 1 via TRANSDERMAL
  Filled 2024-04-09 (×2): qty 1

## 2024-04-09 MED ORDER — APIXABAN 5 MG PO TABS
5.0000 mg | ORAL_TABLET | Freq: Two times a day (BID) | ORAL | Status: DC
  Administered 2024-04-09 – 2024-04-10 (×2): 5 mg via ORAL
  Filled 2024-04-09 (×2): qty 1

## 2024-04-09 MED ORDER — ACETAMINOPHEN 500 MG PO TABS
1000.0000 mg | ORAL_TABLET | Freq: Three times a day (TID) | ORAL | Status: DC
  Administered 2024-04-09 – 2024-04-10 (×3): 1000 mg via ORAL
  Filled 2024-04-09 (×4): qty 2

## 2024-04-09 MED ORDER — OXYCODONE HCL 5 MG PO TABS: 5.0000 mg | ORAL_TABLET | ORAL | Status: DC | PRN

## 2024-04-09 NOTE — Progress Notes (Signed)
 Patient removed telemetry.  NT attempted to reapply telemetry and take VS and patient refused.  Upon RN assessment, patient standing at bedside.  I asked if I could check her vital signs and apply telemetry.  She stated she did not need the monitor she knew she was in atrial fibrillation and the monitor will not help that.  She stated she needed to calm down and get right with herself first before we could help her.   Patient states she understands her heart rate has been high and states we are not helping by being in her room.  I also explained to her how checking her potassium and magnesium  would be helpful too as we can supplement them if they are low and how those affect her heart rhythm.  She stated she understood but not agreeable to have blood drawn at this time.  I asked husband at the bedside if he had any suggestions on how we could help her.  He said I have tried to get her to put her monitor back on and she is hard headed and has a mind of her own.  Patient sitting on bedside with call bell in reach and agreed to call when she is willing to have vital signs and telemetry applied.  She denies any shortness of breath, pain or other symptoms.  She verbalized understanding to call for assistance.  Dr. Marcene notified.

## 2024-04-09 NOTE — TOC CM/SW Note (Signed)
 Transition of Care [facility]) - Inpatient Brief Assessment   Patient Details  Name: Katrina Oliver MRN: [id number] Date of Birth: Dec 14, 1945  Transition of Care [facility]) CM/SW Contact:    Sudie Erminio Deems, RN Phone Number: 04/09/2024, 2:38 PM   Clinical Narrative: Patient presented for chest pain-patient with a  previous mechanical fall resulting in rib fractures and a possible left hemopneumothorax. PTA patient was independent from home with spouse. Patient states she has a PCP and has no issues with transportation. ICM will continue to follow for additional needs as the patient progresses.    Transition of Care Asessment: Insurance and Status: Insurance coverage has been reviewed Patient has primary care physician: Yes Home environment has been reviewed: reviewed Prior level of function:: independent Prior/Current Home Services: No current home services Social Drivers of Health Review: SDOH reviewed no interventions necessary Readmission risk has been reviewed: Yes Transition of care needs: no transition of care needs at this time

## 2024-04-09 NOTE — Progress Notes (Signed)
 Follow up from pm cbc which reveals a stable hgb of 14 up from 13.  No evidence of active bleeding.  She may resume her Eliquis .  I have discussed this with the primary service.  Trauma service will be available as needed.  Burnard FORBES Banter, PA-C 4:19 PM 04/09/2024

## 2024-04-09 NOTE — Progress Notes (Signed)
 [facility] PROGRESS NOTE    Progress Note  Katrina Oliver  FMW:[id number] DOB: April 28, 1945 DOA: 04/07/2024 PCP: Chrystal Lamarr RAMAN, MD     Brief Narrative:   Katrina Oliver is an 78 y.o. female past medical history of atrial fibrillation on Eliquis , essential hypertension comes in complaining of left-sided pain, with a history of fall over the Thanksgiving weekend with trauma to the left side, during that evaluation workup was unremarkable patient was sent home from urgent care with supportive measures.,  She relates he started having left-sided pain and shortness of breath for about 5 days prior to admission persistent.  Went to saw her PCP who did multiple images and after results came back she was sent to the ED for further evaluation.  She relates uncontrolled pain and some shortness of breath Thus far has improved.  Await CBC on 12/15 to restart Eliquis   Assessment/Plan:   Mechanical fall leading to multiple rib fractures fifth through 9, eighth rib has 3 fractures/ Hemopneumothorax on left: General Surgery at [facility] was consulted who recommended to hold Eliquis  and transfer to [facility] for trauma consult and chest tube as needed.  They also recommended supportive care with pulmonary toileting and continue oxygen. No PT follow-up needed Pain improved, x-ray stable-per trauma can resume Eliquis  if CBC stable (currently pending)  Primary hypertension Question due to pain resume home regimen.  Chronic atrial fibrillation (HCC) Rate controlled metoprolol   -hold Eliquis  until CBC back  Hyperlipidemia:  continue Crestor .   DVT prophylaxis: SCD Family Communication: At bedside Status is: Inpatient Remains inpatient appropriate because: Multiple rib fractures with possible hemothorax Home in 24 to 48 hours   Code Status:     Full  IV Access:   Peripheral IV   Procedures and diagnostic studies:   DG CHEST PORT 1 VIEW Result Date:  04/09/2024 EXAM: 1 VIEW(S) XRAY OF THE CHEST 04/09/2024 05:49:15 AM COMPARISON: 04/07/2024 CLINICAL HISTORY: Hemothorax on right. FINDINGS: LUNGS AND PLEURA: Stable small left apical pneumothorax. Stable small left pleural effusion with overlying left basilar atelectasis. Stable right apical pleural calcification and scarring. No focal pulmonary opacity. HEART AND MEDIASTINUM: Aortic arch atherosclerosis. No acute abnormality of the cardiac and mediastinal silhouettes. BONES AND SOFT TISSUES: Mildly displaced left lateral eighth and ninth rib fractures. IMPRESSION: 1. Stable small left apical pneumothorax and small left pleural effusion with overlying left basilar atelectasis. 2. Mildly displaced left lateral eighth and ninth rib fractures. 3. Stable right apical pleural calcification and scarring. Electronically signed by: Waddell Calk MD 04/09/2024 08:29 AM EST RP Workstation: [id number]   CT CHEST ABDOMEN PELVIS W CONTRAST Result Date: 04/07/2024 CLINICAL DATA:  Patient is status post fall with known multiple rib fractures and LEFT pneumothorax EXAM: CT CHEST, ABDOMEN, AND PELVIS WITH CONTRAST TECHNIQUE: Multidetector CT imaging of the chest, abdomen and pelvis was performed following the standard protocol during bolus administration of intravenous contrast. RADIATION DOSE REDUCTION: This exam was performed according to the departmental dose-optimization program which includes automated exposure control, adjustment of the mA and/or kV according to patient size and/or use of iterative reconstruction technique. CONTRAST:  OMNIPAQUE  IOHEXOL  300 MG/ML  SOLN COMPARISON:  Same day radiograph.  December 15, 2019 FINDINGS: CT CHEST FINDINGS Cardiovascular: Heart is normal in size. No pericardial effusion. Atherosclerotic calcifications of the nonaneurysmal abdominal aorta. Mediastinum/Nodes: Visualized thyroid is unremarkable. No axillary or mediastinal adenopathy. Lungs/Pleura: There is a small to moderate  LEFT-sided pneumothorax. There is a small to moderate pleural effusion which measures  slightly higher density than simple fluid and likely reflects a degree of hemothorax. Moderate centrilobular emphysema. Near complete atelectasis of the lingula and LEFT lower lobe. Musculoskeletal: There are multiple LEFT-sided rib fractures involving the anterolateral fifth through eighth ribs. There is adjacent periosteal reaction suggesting subacute nature. There are multiple fractures of the sixth rib laterally. There is an additional nondisplaced fracture of the of the posterior seventh and eighth ribs. There is a comminuted fracture involving the posterolateral ninth rib with adjacent periosteal reaction. There are at least 3 fractures of the eighth rib. CT ABDOMEN PELVIS FINDINGS Hepatobiliary: Low-density of the liver diffusely. Gallbladder is unremarkable. Subcentimeter hypodense lesions are too small to accurately characterize. Pancreas: Unremarkable. No pancreatic ductal dilatation or surrounding inflammatory changes. Spleen: Normal in size without focal abnormality. Adrenals/Urinary Tract: Adrenal glands are unremarkable. Kidneys enhance symmetrically. No hydronephrosis. No obstructing nephrolithiasis. Bladder is obscured by metallic streak artifact. Stomach/Bowel: No evidence of bowel obstruction. Appendiceal candidate is unremarkable. No ancillary evidence of appendicitis. Small hiatal hernia. Vascular/Lymphatic: Sclerotic calcifications of the nonaneurysmal abdominal aorta. No suspicious lymphadenopathy visualized. Circumaortic LEFT renal vein. Reproductive: Pelvis is obscured by streak artifact. Other: No free air. Musculoskeletal: Status post RIGHT hip arthroplasty. Predominately facet arthropathy of the lumbar spine. No acute fracture or static subluxation of the lumbar spine. IMPRESSION: 1. There are multiple subacute appearing LEFT-sided rib fractures involving the fifth through ninth ribs, several which are  fractured in multiple locations. There are at least 3 fractures involving the eighth rib. 2. There is a small to moderate LEFT-sided pneumothorax. There is a small to moderate LEFT pleural effusion which measures slightly higher density than simple fluid and likely reflects a degree of hemothorax. 3. Near complete atelectasis of the lingula and LEFT lower lobe. 4. No acute traumatic injury within the abdomen or pelvis. 5. Favored underlying hepatic steatosis. Aortic Atherosclerosis (ICD10-I70.0) and Emphysema (ICD10-J43.9). Electronically Signed   By: Corean Salter M.D.   On: 04/07/2024 17:28   CT CERVICAL SPINE WO CONTRAST Result Date: 04/07/2024 EXAM: CT CERVICAL SPINE WITHOUT CONTRAST 04/07/2024 04:51:22 PM TECHNIQUE: CT of the cervical spine was performed without the administration of intravenous contrast. Multiplanar reformatted images are provided for review. Automated exposure control, iterative reconstruction, and/or weight based adjustment of the mA/kV was utilized to reduce the radiation dose to as low as reasonably achievable. COMPARISON: None available. CLINICAL HISTORY: Polytrauma, blunt. Fall before Thanksgiving. FINDINGS: BONES AND ALIGNMENT: Normal alignment. No acute fracture or suspicious lesion. DEGENERATIVE CHANGES: Moderate atlantodental degenerative changes. Mild cervical spondylosis and moderately advanced facet arthrosis. No evidence of high grade spinal canal stenosis. SOFT TISSUES: No prevertebral soft tissue swelling. The chest is reported separately. IMPRESSION: 1. No acute cervical spine fracture or traumatic malalignment. Electronically signed by: Dasie Hamburg MD 04/07/2024 05:03 PM EST RP Workstation: [id number]   CT HEAD WO CONTRAST Result Date: 04/07/2024 EXAM: CT HEAD WITHOUT 04/07/2024 04:51:22 PM TECHNIQUE: CT of the head was performed without the administration of intravenous contrast. Automated exposure control, iterative reconstruction, and/or weight based  adjustment of the mA/kV was utilized to reduce the radiation dose to as low as reasonably achievable. COMPARISON: None available. CLINICAL HISTORY: Head trauma, moderate-severe. Fall before thanksgiving. FINDINGS: BRAIN AND VENTRICLES: There is no evidence of an acute infarct, intracranial hemorrhage, mass, midline shift, hydrocephalus, or extra-axial fluid collection. There is mild generalized cerebral atrophy. Cerebral white matter hypodensities are nonspecific but compatible with mild chronic small vessel ischemic disease. ORBITS: Bilateral cataract extraction. SINUSES AND MASTOIDS: Mucosal thickening in  the left sphenoid sinus. Clear mastoid air cells. SOFT TISSUES AND SKULL: No acute skull fracture. No acute soft tissue abnormality. IMPRESSION: 1. No acute intracranial abnormality. 2. Mild chronic small vessel ischemic disease. Electronically signed by: Dasie Hamburg MD 04/07/2024 04:59 PM EST RP Workstation: [id number]     Medical Consultants:   Trauma surgery   Subjective:   Breathing better, pain better  Objective:    Vitals:   04/08/24 2228 04/09/24 0740 04/09/24 0927 04/09/24 1147  BP: (!) 161/73 96/69 110/75 112/66  Pulse: (!) 110 100  83  Resp: 18 18  18   Temp: 97.8 F (36.6 C) 97.6 F (36.4 C)  98.2 F (36.8 C)  TempSrc: Oral Oral  Oral  SpO2: 93% 93%  95%  Weight:      Height:       SpO2: 95 % O2 Flow Rate (L/min): 4 L/min   Intake/Output Summary (Last 24 hours) at 04/09/2024 1241 Last data filed at 04/08/2024 2200 Gross per 24 hour  Intake 240 ml  Output --  Net 240 ml   Filed Weights   04/08/24 1603  Weight: (P) 60.7 kg    Exam: Upwalking around room. Lungs clear   Data Reviewed:    Labs: Basic Metabolic Panel: Recent Labs  Lab 04/07/24 1525 04/07/24 1537 04/08/24 0630 04/09/24 0513  NA 140 138 136 140  K 3.9 4.5 3.9 3.9  CL 101 101 100 100  CO2 27  --  26 22  GLUCOSE 112* 114* 126* 126*  BUN 19 27* 17 16  CREATININE 0.66 0.60 0.55  1.06*  CALCIUM  9.6  --  9.0 9.6  MG  --   --   --  2.1   GFR Estimated Creatinine Clearance: 34.9 mL/min (A) (by C-G formula based on SCr of 1.06 mg/dL (H)). Liver Function Tests: Recent Labs  Lab 04/07/24 1525 04/08/24 0630  AST 25 22  ALT 15 12  ALKPHOS 93 82  BILITOT 0.7 1.1  PROT 7.2 6.7  ALBUMIN 4.4 4.0   No results for input(s): LIPASE, AMYLASE in the last 168 hours. No results for input(s): AMMONIA in the last 168 hours. Coagulation profile Recent Labs  Lab 04/07/24 1525  INR 1.0   COVID-19 Labs  No results for input(s): DDIMER, FERRITIN, LDH, CRP in the last 72 hours.  No results found for: SARSCOV2NAA  CBC: Recent Labs  Lab 04/07/24 1525 04/07/24 1537 04/08/24 0630  WBC 6.7  --  4.9  HGB 15.1* 16.0* 13.6  HCT 46.2* 47.0* 41.3  MCV 102.7*  --  102.7*  PLT 181  --  159   Cardiac Enzymes: No results for input(s): CKTOTAL, CKMB, CKMBINDEX, TROPONINI in the last 168 hours. BNP (last 3 results) No results for input(s): PROBNP in the last 8760 hours. CBG: No results for input(s): GLUCAP in the last 168 hours. D-Dimer: No results for input(s): DDIMER in the last 72 hours. Hgb A1c: No results for input(s): HGBA1C in the last 72 hours. Lipid Profile: No results for input(s): CHOL, HDL, LDLCALC, TRIG, CHOLHDL, LDLDIRECT in the last 72 hours. Thyroid function studies: No results for input(s): TSH, T4TOTAL, T3FREE, THYROIDAB in the last 72 hours.  Invalid input(s): FREET3 Anemia work up: No results for input(s): VITAMINB12, FOLATE, FERRITIN, TIBC, IRON, RETICCTPCT in the last 72 hours. Sepsis Labs: Recent Labs  Lab 04/07/24 1525 04/07/24 1537 04/08/24 0630  WBC 6.7  --  4.9  LATICACIDVEN  --  1.3  --    Microbiology No  results found for this or any previous visit (from the past 240 hours).   Medications:    acetaminophen   1,000 mg Oral TID   lidocaine   1 patch Transdermal Q24H    loratadine   10 mg Oral Daily   metoprolol  tartrate  25 mg Oral BID   multivitamin with minerals  1 tablet Oral Daily   polyethylene glycol  17 g Oral BID   rosuvastatin   10 mg Oral QHS   Continuous Infusions:    LOS: 2 days   Katrina Oliver Bowl  [facility]  04/09/2024, 12:41 PM

## 2024-04-09 NOTE — Evaluation (Signed)
 Physical Therapy Evaluation Patient Details Name: Katrina Oliver MRN: [id number] DOB: [birth year] Today's Date: 04/09/2024  History of Present Illness  Patient is a 77y.o. female admitted on 12/13 with c/o left sided chest pain. Patient had a fall on Thanksgiving, went to urgent care where xrays where unremarkable. Since then she has had SOB and mild pain. CT chest revealed multiple rib fractures on left 5-9 with several ribs fractures in multiple locations. PMH includes: afib, HTN, and HLD.  Clinical Impression  Patient ambulating around room IND upon PT arrival. Able to complete long distance ambulation, stairs and DGI [facility]. Pt appears to be at her functional baseline. Education provided regarding pain management of rib cage. No follow up therapy needs. PT will sign off. Please re-consult if there is a change in medical status.         If plan is discharge home, recommend the following:     Can travel by private vehicle        Equipment Recommendations None recommended by PT  Recommendations for Other Services       Functional Status Assessment       Precautions / Restrictions Precautions Precautions: None Recall of Precautions/Restrictions: Intact Restrictions Weight Bearing Restrictions Per Provider Order: No      Mobility  Bed Mobility                    Transfers Overall transfer level: Independent Equipment used: None                    Ambulation/Gait Ambulation/Gait assistance: Printmaker (Feet): 200 Feet              Stairs Stairs: Yes Stairs assistance: Supervision Stair Management: One rail Left, Alternating pattern Number of Stairs: 6    Wheelchair Mobility     Tilt Bed    Modified Rankin (Stroke Patients Only)       Balance                                     Dynamic Gait Index Level Surface: Normal Change in Gait Speed: Normal Gait with Horizontal Head Turns: Normal Gait with  Vertical Head Turns: Normal Gait and Pivot Turn: Normal Step Over Obstacle: Normal Step Around Obstacles: Normal Steps: Mild Impairment Total Score: 23       Pertinent Vitals/Pain Pain Assessment Pain Assessment: Faces Faces Pain Scale: Hurts a little bit Pain Location: L rib cage Pain Descriptors / Indicators: Dull, Discomfort Pain Intervention(s): Monitored during session    Home Living   Living Arrangements: Spouse/significant other               Home Equipment: Rolling Walker (2 wheels) Additional Comments: IND with all functional mobility, has not used RW since her L THA 3 years ago    Prior Function                       Extremity/Trunk Assessment                Communication   Communication Communication: No apparent difficulties    Journalist, Newspaper  General Comments      Exercises     Assessment/Plan    PT Assessment    PT Problem List         PT Treatment Interventions      PT Goals (Current goals can be found in the Care Plan section)       Frequency       Co-evaluation               AM-PAC PT 6 Clicks Mobility  Outcome Measure Help needed turning from your back to your side while in a flat bed without using bedrails?: None Help needed moving from lying on your back to sitting on the side of a flat bed without using bedrails?: None Help needed moving to and from a bed to a chair (including a wheelchair)?: None Help needed standing up from a chair using your arms (e.g., wheelchair or bedside chair)?: None Help needed to walk in hospital room?: None Help needed climbing 3-5 steps with a railing? : A Little 6 Click Score: 23    End of Session Equipment Utilized During Treatment: Gait belt Activity Tolerance: Patient tolerated treatment well Patient left: in bed Nurse Communication: Mobility status      Time: 1007-1017 PT Time Calculation  (min) (ACUTE ONLY): 10 min   Charges:   PT Evaluation $PT Eval Low Complexity: 1 Low   PT General Charges $$ ACUTE PT VISIT: 1 Visit         Isaiah DEL. Shyanne Mcclary, PT, DPT   [employer] 04/09/2024, 11:58 AM

## 2024-04-09 NOTE — Progress Notes (Signed)
 Patient's husband advised nurse that patient was willing to put monitor back on.   Patient did allow nursing to reapply heart monitor, patient agreed to blood work as well and continues declining IV metoprolol .  She inquired about follow up chest xray that the doctor ordered for today and advised patient that the xray would be done portable at bedside this morning.  Currently ordered at 6 am.

## 2024-04-09 NOTE — Progress Notes (Signed)
 Progress Note     Subjective: Pt denies significant SOB. She reports some chest pain but overall improving.   Objective: Vital signs in last 24 hours: Temp:  [97.6 F (36.4 C)-98.1 F (36.7 C)] 97.6 F (36.4 C) (12/15 0740) Pulse Rate:  [75-121] 100 (12/15 0740) Resp:  [17-20] 18 (12/15 0740) BP: (96-202)/(69-95) 110/75 (12/15 0927) SpO2:  [93 %-100 %] 93 % (12/15 0740) Weight:  [60.7 kg] (P) 60.7 kg (12/14 1603) Last BM Date : 04/07/24  Intake/Output from previous day: 12/14 0701 - 12/15 0700 In: 240 [P.O.:240] Out: -  Intake/Output this shift: No intake/output data recorded.  PE: General: pleasant, WD, thin female who is laying in bed in NAD HEENT: pupils equal, EOMI Heart: irregularly irregular Lungs: No wheezes, rhonchi, or rales noted.  Respiratory effort nonlabored on room air MS: all 4 extremities are symmetrical with no cyanosis, clubbing, or edema. Skin: warm and dry with no masses, lesions, or rashes Psych: A&Ox3 with an appropriate affect.    Lab Results:  Recent Labs    04/07/24 1525 04/07/24 1537 04/08/24 0630  WBC 6.7  --  4.9  HGB 15.1* 16.0* 13.6  HCT 46.2* 47.0* 41.3  PLT 181  --  159   BMET Recent Labs    04/08/24 0630 04/09/24 0513  NA 136 140  K 3.9 3.9  CL 100 100  CO2 26 22  GLUCOSE 126* 126*  BUN 17 16  CREATININE 0.55 1.06*  CALCIUM  9.0 9.6   PT/INR Recent Labs    04/07/24 1525  LABPROT 13.6  INR 1.0   CMP     Component Value Date/Time   NA 140 04/09/2024 0513   K 3.9 04/09/2024 0513   CL 100 04/09/2024 0513   CO2 22 04/09/2024 0513   GLUCOSE 126 (H) 04/09/2024 0513   BUN 16 04/09/2024 0513   CREATININE 1.06 (H) 04/09/2024 0513   CALCIUM  9.6 04/09/2024 0513   PROT 6.7 04/08/2024 0630   ALBUMIN 4.0 04/08/2024 0630   AST 22 04/08/2024 0630   ALT 12 04/08/2024 0630   ALKPHOS 82 04/08/2024 0630   BILITOT 1.1 04/08/2024 0630   GFRNONAA 53 (L) 04/09/2024 0513   Lipase  No results found for:  LIPASE     Studies/Results: DG CHEST PORT 1 VIEW Result Date: 04/09/2024 EXAM: 1 VIEW(S) XRAY OF THE CHEST 04/09/2024 05:49:15 AM COMPARISON: 04/07/2024 CLINICAL HISTORY: Hemothorax on right. FINDINGS: LUNGS AND PLEURA: Stable small left apical pneumothorax. Stable small left pleural effusion with overlying left basilar atelectasis. Stable right apical pleural calcification and scarring. No focal pulmonary opacity. HEART AND MEDIASTINUM: Aortic arch atherosclerosis. No acute abnormality of the cardiac and mediastinal silhouettes. BONES AND SOFT TISSUES: Mildly displaced left lateral eighth and ninth rib fractures. IMPRESSION: 1. Stable small left apical pneumothorax and small left pleural effusion with overlying left basilar atelectasis. 2. Mildly displaced left lateral eighth and ninth rib fractures. 3. Stable right apical pleural calcification and scarring. Electronically signed by: Waddell Calk MD 04/09/2024 08:29 AM EST RP Workstation: [id number]   CT CHEST ABDOMEN PELVIS W CONTRAST Result Date: 04/07/2024 CLINICAL DATA:  Patient is status post fall with known multiple rib fractures and LEFT pneumothorax EXAM: CT CHEST, ABDOMEN, AND PELVIS WITH CONTRAST TECHNIQUE: Multidetector CT imaging of the chest, abdomen and pelvis was performed following the standard protocol during bolus administration of intravenous contrast. RADIATION DOSE REDUCTION: This exam was performed according to the departmental dose-optimization program which includes automated exposure control, adjustment of the mA  and/or kV according to patient size and/or use of iterative reconstruction technique. CONTRAST:  OMNIPAQUE  IOHEXOL  300 MG/ML  SOLN COMPARISON:  Same day radiograph.  December 15, 2019 FINDINGS: CT CHEST FINDINGS Cardiovascular: Heart is normal in size. No pericardial effusion. Atherosclerotic calcifications of the nonaneurysmal abdominal aorta. Mediastinum/Nodes: Visualized thyroid is unremarkable. No axillary or  mediastinal adenopathy. Lungs/Pleura: There is a small to moderate LEFT-sided pneumothorax. There is a small to moderate pleural effusion which measures slightly higher density than simple fluid and likely reflects a degree of hemothorax. Moderate centrilobular emphysema. Near complete atelectasis of the lingula and LEFT lower lobe. Musculoskeletal: There are multiple LEFT-sided rib fractures involving the anterolateral fifth through eighth ribs. There is adjacent periosteal reaction suggesting subacute nature. There are multiple fractures of the sixth rib laterally. There is an additional nondisplaced fracture of the of the posterior seventh and eighth ribs. There is a comminuted fracture involving the posterolateral ninth rib with adjacent periosteal reaction. There are at least 3 fractures of the eighth rib. CT ABDOMEN PELVIS FINDINGS Hepatobiliary: Low-density of the liver diffusely. Gallbladder is unremarkable. Subcentimeter hypodense lesions are too small to accurately characterize. Pancreas: Unremarkable. No pancreatic ductal dilatation or surrounding inflammatory changes. Spleen: Normal in size without focal abnormality. Adrenals/Urinary Tract: Adrenal glands are unremarkable. Kidneys enhance symmetrically. No hydronephrosis. No obstructing nephrolithiasis. Bladder is obscured by metallic streak artifact. Stomach/Bowel: No evidence of bowel obstruction. Appendiceal candidate is unremarkable. No ancillary evidence of appendicitis. Small hiatal hernia. Vascular/Lymphatic: Sclerotic calcifications of the nonaneurysmal abdominal aorta. No suspicious lymphadenopathy visualized. Circumaortic LEFT renal vein. Reproductive: Pelvis is obscured by streak artifact. Other: No free air. Musculoskeletal: Status post RIGHT hip arthroplasty. Predominately facet arthropathy of the lumbar spine. No acute fracture or static subluxation of the lumbar spine. IMPRESSION: 1. There are multiple subacute appearing LEFT-sided rib  fractures involving the fifth through ninth ribs, several which are fractured in multiple locations. There are at least 3 fractures involving the eighth rib. 2. There is a small to moderate LEFT-sided pneumothorax. There is a small to moderate LEFT pleural effusion which measures slightly higher density than simple fluid and likely reflects a degree of hemothorax. 3. Near complete atelectasis of the lingula and LEFT lower lobe. 4. No acute traumatic injury within the abdomen or pelvis. 5. Favored underlying hepatic steatosis. Aortic Atherosclerosis (ICD10-I70.0) and Emphysema (ICD10-J43.9). Electronically Signed   By: Corean Salter M.D.   On: 04/07/2024 17:28   CT CERVICAL SPINE WO CONTRAST Result Date: 04/07/2024 EXAM: CT CERVICAL SPINE WITHOUT CONTRAST 04/07/2024 04:51:22 PM TECHNIQUE: CT of the cervical spine was performed without the administration of intravenous contrast. Multiplanar reformatted images are provided for review. Automated exposure control, iterative reconstruction, and/or weight based adjustment of the mA/kV was utilized to reduce the radiation dose to as low as reasonably achievable. COMPARISON: None available. CLINICAL HISTORY: Polytrauma, blunt. Fall before Thanksgiving. FINDINGS: BONES AND ALIGNMENT: Normal alignment. No acute fracture or suspicious lesion. DEGENERATIVE CHANGES: Moderate atlantodental degenerative changes. Mild cervical spondylosis and moderately advanced facet arthrosis. No evidence of high grade spinal canal stenosis. SOFT TISSUES: No prevertebral soft tissue swelling. The chest is reported separately. IMPRESSION: 1. No acute cervical spine fracture or traumatic malalignment. Electronically signed by: Dasie Hamburg MD 04/07/2024 05:03 PM EST RP Workstation: [id number]   CT HEAD WO CONTRAST Result Date: 04/07/2024 EXAM: CT HEAD WITHOUT 04/07/2024 04:51:22 PM TECHNIQUE: CT of the head was performed without the administration of intravenous contrast. Automated  exposure control, iterative reconstruction, and/or weight based adjustment  of the mA/kV was utilized to reduce the radiation dose to as low as reasonably achievable. COMPARISON: None available. CLINICAL HISTORY: Head trauma, moderate-severe. Fall before thanksgiving. FINDINGS: BRAIN AND VENTRICLES: There is no evidence of an acute infarct, intracranial hemorrhage, mass, midline shift, hydrocephalus, or extra-axial fluid collection. There is mild generalized cerebral atrophy. Cerebral white matter hypodensities are nonspecific but compatible with mild chronic small vessel ischemic disease. ORBITS: Bilateral cataract extraction. SINUSES AND MASTOIDS: Mucosal thickening in the left sphenoid sinus. Clear mastoid air cells. SOFT TISSUES AND SKULL: No acute skull fracture. No acute soft tissue abnormality. IMPRESSION: 1. No acute intracranial abnormality. 2. Mild chronic small vessel ischemic disease. Electronically signed by: Dasie Hamburg MD 04/07/2024 04:59 PM EST RP Workstation: [id number]    Anti-infectives: Anti-infectives (From admission, onward)    None        Assessment/Plan Fall 2 weeks ago L 5-8 rib fractures with L HPTX - CXR this AM with stable L apical PTX and small L pleural effusion, pt clinically doing well on room air - multimodal pain control as needed, IS, pulm toilet A.Fib on eliquis  - per primary team, repeat CBC   FEN: HH/CM diet VTE: ok to resume eliquis  if hgb stable   ID: no current abx  No further recs from trauma standpoint. Pt reports pain is overall improving and is not requiring any supplemental oxygen. CXR stable. Recommend follow up with PCP and discharge when medically cleared. Therapy evals pending.     LOS: 2 days   I reviewed hospitalist notes, last 24 h vitals and pain scores, last 48 h intake and output, last 24 h labs and trends, and last 24 h imaging results.  This care required moderate level of medical decision making.    Burnard JONELLE Louder, [facility] 04/09/2024, 10:50 AM Please see Amion for pager number during day hours 7:00am-4:30pm

## 2024-04-09 NOTE — Progress Notes (Signed)
 Transition of Care [facility]) - CAGE-AID Screening   Patient Details  Name: Katrina Oliver MRN: [id number] Date of Birth: [birth year]   MARINDA LIONEL Sora, RN Phone Number: 04/09/2024, 2:13 PM   Clinical Narrative: Pt reports she quit smoking in 2005 after 20-30years Pt denies elicit substances Pt reports drinking 1-2 glasses of red wine with dinner nightly. Pt states she has been extended periods with no alcohol  with no w/d symptoms.    CAGE-AID Screening:    Have You Ever Felt You Ought to Cut Down on Your Drinking or Drug Use?: No Have People Annoyed You By Critizing Your Drinking Or Drug Use?: No Have You Felt Bad Or Guilty About Your Drinking Or Drug Use?: No Have You Ever Had a Drink or Used Drugs First Thing In The Morning to Steady Your Nerves or to Get Rid of a Hangover?: No CAGE-AID Score: 0

## 2024-04-10 ENCOUNTER — Other Ambulatory Visit (HOSPITAL_COMMUNITY): Payer: Self-pay

## 2024-04-10 LAB — CBC
HCT: 41.7 % (ref 36.0–46.0)
Hemoglobin: 14.2 g/dL (ref 12.0–15.0)
MCH: 34.1 pg — ABNORMAL HIGH (ref 26.0–34.0)
MCHC: 34.1 g/dL (ref 30.0–36.0)
MCV: 100 fL (ref 80.0–100.0)
Platelets: 184 K/uL (ref 150–400)
RBC: 4.17 MIL/uL (ref 3.87–5.11)
RDW: 13 % (ref 11.5–15.5)
WBC: 3.9 K/uL — ABNORMAL LOW (ref 4.0–10.5)
nRBC: 0 % (ref 0.0–0.2)

## 2024-04-10 LAB — BASIC METABOLIC PANEL WITH GFR
Anion gap: 11 (ref 5–15)
BUN: 23 mg/dL (ref 8–23)
CO2: 26 mmol/L (ref 22–32)
Calcium: 8.7 mg/dL — ABNORMAL LOW (ref 8.9–10.3)
Chloride: 101 mmol/L (ref 98–111)
Creatinine, Ser: 0.72 mg/dL (ref 0.44–1.00)
GFR, Estimated: >60 mL/min (ref >60)
Glucose, Bld: 117 mg/dL — ABNORMAL HIGH (ref 70–99)
Potassium: 3.3 mmol/L — ABNORMAL LOW (ref 3.5–5.1)
Sodium: 138 mmol/L (ref 135–145)

## 2024-04-10 MED ORDER — POTASSIUM CHLORIDE CRYS ER 20 MEQ PO TBCR
40.0000 meq | EXTENDED_RELEASE_TABLET | Freq: Once | ORAL | Status: AC
  Administered 2024-04-10: 08:00:00 40 meq via ORAL
  Filled 2024-04-10: qty 2

## 2024-04-10 MED ORDER — LIDOCAINE 4.88 % EX PTCH: 1.0000 | MEDICATED_PATCH | Freq: Every day | CUTANEOUS | Status: AC

## 2024-04-10 MED ORDER — METOPROLOL TARTRATE 25 MG PO TABS
25.0000 mg | ORAL_TABLET | Freq: Two times a day (BID) | ORAL | 0 refills | Status: AC
  Filled 2024-04-10: qty 200, 80d supply, fill #0

## 2024-04-10 MED ORDER — LIDOCAINE 5 % EX PTCH
1.0000 | MEDICATED_PATCH | CUTANEOUS | 0 refills | Status: DC
  Filled 2024-04-10: qty 30, 30d supply, fill #0

## 2024-04-10 MED ORDER — ACETAMINOPHEN 500 MG PO TABS: 1000.0000 mg | ORAL_TABLET | Freq: Three times a day (TID) | ORAL | Status: AC

## 2024-04-10 NOTE — Plan of Care (Signed)
°  Problem: Education: Goal: Knowledge of General Education information will improve Description: Including pain rating scale, medication(s)/side effects and non-pharmacologic comfort measures 04/10/2024 0759 by Kristene Scull, RN Outcome: Adequate for Discharge 04/10/2024 0759 by Kristene Scull, RN Reactivated 04/10/2024 0758 by Kristene Scull, RN Outcome: Adequate for Discharge   Problem: Health Behavior/Discharge Planning: Goal: Ability to manage health-related needs will improve 04/10/2024 0759 by Kristene Scull, RN Outcome: Adequate for Discharge 04/10/2024 0759 by Kristene Scull, RN Reactivated 04/10/2024 0758 by Kristene Scull, RN Outcome: Adequate for Discharge   Problem: Activity: Goal: Risk for activity intolerance will decrease Outcome: Adequate for Discharge   Problem: Clinical Measurements: Goal: Ability to maintain clinical measurements within normal limits will improve Outcome: Adequate for Discharge Goal: Will remain free from infection Outcome: Adequate for Discharge Goal: Diagnostic test results will improve Outcome: Adequate for Discharge Goal: Respiratory complications will improve Outcome: Adequate for Discharge Goal: Cardiovascular complication will be avoided Outcome: Adequate for Discharge   Problem: Pain Managment: Goal: General experience of comfort will improve and/or be controlled Outcome: Adequate for Discharge   Problem: Skin Integrity: Goal: Risk for impaired skin integrity will decrease Outcome: Adequate for Discharge

## 2024-04-10 NOTE — Discharge Summary (Signed)
 Physician Discharge Summary  Katrina Oliver FMW:[id number] DOB: 1945-10-14 DOA: 04/07/2024  PCP: Chrystal Lamarr RAMAN, MD  Admit date: 04/07/2024 Discharge date: 04/10/2024  Admitted From:  Discharge disposition: home   Recommendations for Outpatient Follow-Up:   Family to stay with patient until she returns to normal   Discharge Diagnosis:   Principal Problem:   Hemopneumothorax on left Active Problems:   Primary hypertension   Multiple fractures of ribs   Chronic atrial fibrillation [facility])    Discharge Condition: Improved.  Diet recommendation: Low sodium, heart healthy  Wound care: None.  Code status: Full.   History of Present Illness:   Katrina Oliver is a 78 y.o. female with medical history significant of Atrial fibrillation on Eliquis ,HTN, HLD, who presents to ED with complaint of left sided chest pain. Patient has interim history of fall the weekend of Thanksgiving with trauma to left side. Patient was seen for her injury and cxr was completed which was unremarkable and patient was sent home from Urgent cared with supportive measures.  Patient however states around the middle of the past week she woke up with left sided pain and some mild sob. She states symptoms become persistent so she presented to pcp clinic and was found to have fx ribs/pneumothorax and hemothorax on repeat imaging. Due to this patient was referred to ED for further evaluation. Patient currently notes uncontrolled pain and some feeling of being short of breath.  She notes no fever/chills/ n/v/ HA / vision changes.    Hospital Course by Problem:   Fall 2 weeks ago L 5-8 rib fractures with L HPTX - CXR this AM with stable L apical PTX and small L pleural effusion, pt clinically doing well on room air - multimodal pain control as needed, IS, pulm toilet - PT eval-no follow-up A.Fib on eliquis  -resume Eliquis   Hypertension - Resume home meds  Hyperlipidemia - Resume  home meds  Medical Consultants:   Trauma   Discharge Exam:    Vitals:   04/09/24 2018  BP: (!) 151/61  Pulse: 73  Resp: 18  Temp: 98.3 F (36.8 C)  TempSrc: Oral  SpO2: 99%  Weight:   Height:     General exam: Appears calm and comfortable.  Anxious as her husband is hospitalized currently  The results of significant diagnostics from this hospitalization (including imaging, microbiology, ancillary and laboratory) are listed below for reference.     Procedures and Diagnostic Studies:   CT CHEST ABDOMEN PELVIS W CONTRAST Result Date: 04/07/2024 CLINICAL DATA:  Patient is status post fall with known multiple rib fractures and LEFT pneumothorax EXAM: CT CHEST, ABDOMEN, AND PELVIS WITH CONTRAST TECHNIQUE: Multidetector CT imaging of the chest, abdomen and pelvis was performed following the standard protocol during bolus administration of intravenous contrast. RADIATION DOSE REDUCTION: This exam was performed according to the departmental dose-optimization program which includes automated exposure control, adjustment of the mA and/or kV according to patient size and/or use of iterative reconstruction technique. CONTRAST:  OMNIPAQUE  IOHEXOL  300 MG/ML  SOLN COMPARISON:  Same day radiograph.  December 15, 2019 FINDINGS: CT CHEST FINDINGS Cardiovascular: Heart is normal in size. No pericardial effusion. Atherosclerotic calcifications of the nonaneurysmal abdominal aorta. Mediastinum/Nodes: Visualized thyroid is unremarkable. No axillary or mediastinal adenopathy. Lungs/Pleura: There is a small to moderate LEFT-sided pneumothorax. There is a small to moderate pleural effusion which measures slightly higher density than simple fluid and likely reflects a degree of hemothorax. Moderate centrilobular emphysema. Near complete  atelectasis of the lingula and LEFT lower lobe. Musculoskeletal: There are multiple LEFT-sided rib fractures involving the anterolateral fifth through eighth ribs. There is  adjacent periosteal reaction suggesting subacute nature. There are multiple fractures of the sixth rib laterally. There is an additional nondisplaced fracture of the of the posterior seventh and eighth ribs. There is a comminuted fracture involving the posterolateral ninth rib with adjacent periosteal reaction. There are at least 3 fractures of the eighth rib. CT ABDOMEN PELVIS FINDINGS Hepatobiliary: Low-density of the liver diffusely. Gallbladder is unremarkable. Subcentimeter hypodense lesions are too small to accurately characterize. Pancreas: Unremarkable. No pancreatic ductal dilatation or surrounding inflammatory changes. Spleen: Normal in size without focal abnormality. Adrenals/Urinary Tract: Adrenal glands are unremarkable. Kidneys enhance symmetrically. No hydronephrosis. No obstructing nephrolithiasis. Bladder is obscured by metallic streak artifact. Stomach/Bowel: No evidence of bowel obstruction. Appendiceal candidate is unremarkable. No ancillary evidence of appendicitis. Small hiatal hernia. Vascular/Lymphatic: Sclerotic calcifications of the nonaneurysmal abdominal aorta. No suspicious lymphadenopathy visualized. Circumaortic LEFT renal vein. Reproductive: Pelvis is obscured by streak artifact. Other: No free air. Musculoskeletal: Status post RIGHT hip arthroplasty. Predominately facet arthropathy of the lumbar spine. No acute fracture or static subluxation of the lumbar spine. IMPRESSION: 1. There are multiple subacute appearing LEFT-sided rib fractures involving the fifth through ninth ribs, several which are fractured in multiple locations. There are at least 3 fractures involving the eighth rib. 2. There is a small to moderate LEFT-sided pneumothorax. There is a small to moderate LEFT pleural effusion which measures slightly higher density than simple fluid and likely reflects a degree of hemothorax. 3. Near complete atelectasis of the lingula and LEFT lower lobe. 4. No acute traumatic injury  within the abdomen or pelvis. 5. Favored underlying hepatic steatosis. Aortic Atherosclerosis (ICD10-I70.0) and Emphysema (ICD10-J43.9). Electronically Signed   By: Corean Salter M.D.   On: 04/07/2024 17:28   CT CERVICAL SPINE WO CONTRAST Result Date: 04/07/2024 EXAM: CT CERVICAL SPINE WITHOUT CONTRAST 04/07/2024 04:51:22 PM TECHNIQUE: CT of the cervical spine was performed without the administration of intravenous contrast. Multiplanar reformatted images are provided for review. Automated exposure control, iterative reconstruction, and/or weight based adjustment of the mA/kV was utilized to reduce the radiation dose to as low as reasonably achievable. COMPARISON: None available. CLINICAL HISTORY: Polytrauma, blunt. Fall before Thanksgiving. FINDINGS: BONES AND ALIGNMENT: Normal alignment. No acute fracture or suspicious lesion. DEGENERATIVE CHANGES: Moderate atlantodental degenerative changes. Mild cervical spondylosis and moderately advanced facet arthrosis. No evidence of high grade spinal canal stenosis. SOFT TISSUES: No prevertebral soft tissue swelling. The chest is reported separately. IMPRESSION: 1. No acute cervical spine fracture or traumatic malalignment. Electronically signed by: Dasie Hamburg MD 04/07/2024 05:03 PM EST RP Workstation: [id number]   CT HEAD WO CONTRAST Result Date: 04/07/2024 EXAM: CT HEAD WITHOUT 04/07/2024 04:51:22 PM TECHNIQUE: CT of the head was performed without the administration of intravenous contrast. Automated exposure control, iterative reconstruction, and/or weight based adjustment of the mA/kV was utilized to reduce the radiation dose to as low as reasonably achievable. COMPARISON: None available. CLINICAL HISTORY: Head trauma, moderate-severe. Fall before thanksgiving. FINDINGS: BRAIN AND VENTRICLES: There is no evidence of an acute infarct, intracranial hemorrhage, mass, midline shift, hydrocephalus, or extra-axial fluid collection. There is mild generalized  cerebral atrophy. Cerebral white matter hypodensities are nonspecific but compatible with mild chronic small vessel ischemic disease. ORBITS: Bilateral cataract extraction. SINUSES AND MASTOIDS: Mucosal thickening in the left sphenoid sinus. Clear mastoid air cells. SOFT TISSUES AND SKULL: No acute skull fracture. No  acute soft tissue abnormality. IMPRESSION: 1. No acute intracranial abnormality. 2. Mild chronic small vessel ischemic disease. Electronically signed by: Dasie Hamburg MD 04/07/2024 04:59 PM EST RP Workstation: [id number]     Labs:   Basic Metabolic Panel: Recent Labs  Lab 04/07/24 1525 04/07/24 1537 04/08/24 0630 04/09/24 0513 04/10/24 0404  NA 140 138 136 140 138  K 3.9 4.5 3.9 3.9 3.3*  CL 101 101 100 100 101  CO2 27  --  26 22 26   GLUCOSE 112* 114* 126* 126* 117*  BUN 19 27* 17 16 23   CREATININE 0.66 0.60 0.55 1.06* 0.72  CALCIUM  9.6  --  9.0 9.6 8.7*  MG  --   --   --  2.1  --    GFR Estimated Creatinine Clearance: 46.2 mL/min (by C-G formula based on SCr of 0.72 mg/dL). Liver Function Tests: Recent Labs  Lab 04/07/24 1525 04/08/24 0630  AST 25 22  ALT 15 12  ALKPHOS 93 82  BILITOT 0.7 1.1  PROT 7.2 6.7  ALBUMIN 4.4 4.0   No results for input(s): LIPASE, AMYLASE in the last 168 hours. No results for input(s): AMMONIA in the last 168 hours. Coagulation profile Recent Labs  Lab 04/07/24 1525  INR 1.0    CBC: Recent Labs  Lab 04/07/24 1525 04/07/24 1537 04/08/24 0630 04/09/24 1538 04/10/24 0404  WBC 6.7  --  4.9 4.7 3.9*  HGB 15.1* 16.0* 13.6 14.7 14.2  HCT 46.2* 47.0* 41.3 43.4 41.7  MCV 102.7*  --  102.7* 100.9* 100.0  PLT 181  --  159 199 184   Cardiac Enzymes: No results for input(s): CKTOTAL, CKMB, CKMBINDEX, TROPONINI in the last 168 hours. BNP: Invalid input(s): POCBNP CBG: No results for input(s): GLUCAP in the last 168 hours. D-Dimer No results for input(s): DDIMER in the last 72 hours. Hgb A1c No results  for input(s): HGBA1C in the last 72 hours. Lipid Profile No results for input(s): CHOL, HDL, LDLCALC, TRIG, CHOLHDL, LDLDIRECT in the last 72 hours. Thyroid function studies No results for input(s): TSH, T4TOTAL, T3FREE, THYROIDAB in the last 72 hours.  Invalid input(s): FREET3 Anemia work up No results for input(s): VITAMINB12, FOLATE, FERRITIN, TIBC, IRON, RETICCTPCT in the last 72 hours. Microbiology No results found for this or any previous visit (from the past 240 hours).   Discharge Instructions:   Discharge Instructions     Diet - low sodium heart healthy   Complete by: As directed    Discharge instructions   Complete by: As directed    Continue IS Family to monitor 24/7 until back to baseline   Increase activity slowly   Complete by: As directed       Allergies as of 04/10/2024       Reactions   Other Other (See Comments)   alloway eye drops   Alaway [ketotifen Fumarate]    Eye drops - causes eye irritation, redness and swelling    Pollen Extract    Trees, Grass        Medication List     STOP taking these medications    metroNIDAZOLE 0.75 % gel Commonly known as: METROGEL       TAKE these medications    acetaminophen  500 MG tablet Commonly known as: TYLENOL  Take 2 tablets (1,000 mg total) by mouth 3 (three) times daily for 7 days. What changed: See the new instructions.   apixaban  5 MG Tabs tablet Commonly known as: Eliquis  Take one tablet by mouth twice  daily   cetirizine 10 MG tablet Commonly known as: ZYRTEC Take 10 mg by mouth daily. What changed:  when to take this reasons to take this   Estradiol 10 MCG Tabs vaginal tablet Place 1 tablet vaginally once a week.   fluticasone  50 MCG/ACT nasal spray Commonly known as: FLONASE  Place 1 spray into both nostrils as needed for allergies or rhinitis.   Lidocaine  4.88 % Ptch Apply 1 patch topically daily at 12 noon.   metoprolol  tartrate 25 MG  tablet Commonly known as: LOPRESSOR  Take 1 tablet (25 mg total) by mouth 2 (two) times daily. May take extra 12.5 mg ( 1/2 tab) once daily if needed for palpitations or elevated HR above 100 What changed:  how much to take how to take this when to take this additional instructions   multivitamin tablet Take 1 tablet by mouth daily.   rosuvastatin  10 MG tablet Commonly known as: CRESTOR  Take 10 mg by mouth at bedtime.   Systane Complete 0.6 % Soln Generic drug: Propylene Glycol Place 1 drop into both eyes 2 (two) times daily as needed (dry eyes).          Time coordinating discharge: 45 min  Signed:  Harlene RAYMOND Bowl DO  [facility] 04/10/2024, 4:46 PM
